# Patient Record
Sex: Female | Born: 1970 | Race: Black or African American | Hispanic: No | Marital: Married | Smoking: Current every day smoker
Health system: Southern US, Community
[De-identification: ages and names within clinical notes are randomized; demographics above are authoritative.]

## PROBLEM LIST (undated history)

## (undated) DIAGNOSIS — R011 Cardiac murmur, unspecified: Secondary | ICD-10-CM

## (undated) DIAGNOSIS — E7800 Pure hypercholesterolemia, unspecified: Secondary | ICD-10-CM

## (undated) DIAGNOSIS — F329 Major depressive disorder, single episode, unspecified: Secondary | ICD-10-CM

## (undated) DIAGNOSIS — E119 Type 2 diabetes mellitus without complications: Secondary | ICD-10-CM

## (undated) DIAGNOSIS — F32A Depression, unspecified: Secondary | ICD-10-CM

## (undated) DIAGNOSIS — R51 Headache: Secondary | ICD-10-CM

## (undated) HISTORY — PX: WISDOM TOOTH EXTRACTION: SHX21

## (undated) HISTORY — PX: OTHER SURGICAL HISTORY: SHX169

## (undated) SURGERY — Surgical Case
Anesthesia: *Unknown

---

## 2006-10-17 ENCOUNTER — Emergency Department (HOSPITAL_COMMUNITY): Admission: EM | Admit: 2006-10-17 | Discharge: 2006-10-17 | Payer: Self-pay | Admitting: Family Medicine

## 2006-12-06 ENCOUNTER — Emergency Department (HOSPITAL_COMMUNITY): Admission: EM | Admit: 2006-12-06 | Discharge: 2006-12-06 | Payer: Self-pay | Admitting: Emergency Medicine

## 2006-12-27 ENCOUNTER — Emergency Department (HOSPITAL_COMMUNITY): Admission: EM | Admit: 2006-12-27 | Discharge: 2006-12-27 | Payer: Self-pay | Admitting: Family Medicine

## 2007-01-20 ENCOUNTER — Emergency Department (HOSPITAL_COMMUNITY): Admission: EM | Admit: 2007-01-20 | Discharge: 2007-01-20 | Payer: Self-pay | Admitting: Emergency Medicine

## 2007-06-17 ENCOUNTER — Emergency Department (HOSPITAL_COMMUNITY): Admission: EM | Admit: 2007-06-17 | Discharge: 2007-06-17 | Payer: Self-pay | Admitting: Emergency Medicine

## 2007-07-10 ENCOUNTER — Emergency Department (HOSPITAL_COMMUNITY): Admission: EM | Admit: 2007-07-10 | Discharge: 2007-07-10 | Payer: Self-pay | Admitting: Emergency Medicine

## 2007-10-20 ENCOUNTER — Emergency Department (HOSPITAL_COMMUNITY): Admission: EM | Admit: 2007-10-20 | Discharge: 2007-10-20 | Payer: Self-pay | Admitting: Family Medicine

## 2008-06-12 ENCOUNTER — Emergency Department (HOSPITAL_COMMUNITY): Admission: EM | Admit: 2008-06-12 | Discharge: 2008-06-12 | Payer: Self-pay | Admitting: Family Medicine

## 2009-02-26 ENCOUNTER — Emergency Department (HOSPITAL_BASED_OUTPATIENT_CLINIC_OR_DEPARTMENT_OTHER): Admission: EM | Admit: 2009-02-26 | Discharge: 2009-02-26 | Payer: Self-pay | Admitting: Emergency Medicine

## 2009-02-26 ENCOUNTER — Ambulatory Visit: Payer: Self-pay | Admitting: Diagnostic Radiology

## 2010-09-22 LAB — POCT CARDIAC MARKERS

## 2010-09-22 LAB — D-DIMER, QUANTITATIVE: D-Dimer, Quant: 0.28 ug/mL-FEU (ref 0.00–0.48)

## 2011-01-09 ENCOUNTER — Other Ambulatory Visit (HOSPITAL_COMMUNITY)
Admission: RE | Admit: 2011-01-09 | Discharge: 2011-01-09 | Disposition: A | Discharge status: Home / Self Care | Payer: BC Managed Care – PPO | Source: Ambulatory Visit | Attending: Obstetrics and Gynecology | Admitting: Obstetrics and Gynecology

## 2011-01-09 DIAGNOSIS — Z113 Encounter for screening for infections with a predominantly sexual mode of transmission: Secondary | ICD-10-CM | POA: Insufficient documentation

## 2011-01-09 DIAGNOSIS — Z1159 Encounter for screening for other viral diseases: Secondary | ICD-10-CM | POA: Insufficient documentation

## 2011-01-09 DIAGNOSIS — Z01419 Encounter for gynecological examination (general) (routine) without abnormal findings: Secondary | ICD-10-CM | POA: Insufficient documentation

## 2011-02-23 ENCOUNTER — Other Ambulatory Visit (HOSPITAL_COMMUNITY): Payer: BC Managed Care – PPO

## 2011-03-16 ENCOUNTER — Encounter (HOSPITAL_COMMUNITY): Payer: Self-pay

## 2011-03-16 ENCOUNTER — Encounter (HOSPITAL_COMMUNITY)
Admission: RE | Admit: 2011-03-16 | Discharge: 2011-03-16 | Disposition: A | Discharge status: Home / Self Care | Payer: BC Managed Care – PPO | Source: Ambulatory Visit | Attending: Obstetrics and Gynecology | Admitting: Obstetrics and Gynecology

## 2011-03-16 HISTORY — DX: Depression, unspecified: F32.A

## 2011-03-16 HISTORY — DX: Major depressive disorder, single episode, unspecified: F32.9

## 2011-03-16 HISTORY — DX: Cardiac murmur, unspecified: R01.1

## 2011-03-16 HISTORY — DX: Headache: R51

## 2011-03-16 LAB — CBC
HCT: 37.5 % (ref 36.0–46.0)
MCHC: 32.5 g/dL (ref 30.0–36.0)
MCV: 81.2 fL (ref 78.0–100.0)
RDW: 15.4 % (ref 11.5–15.5)

## 2011-03-16 NOTE — Pre-Procedure Instructions (Signed)
Ok to see anesthesia DOS

## 2011-03-16 NOTE — Patient Instructions (Addendum)
   Your procedure is scheduled on:  Monday, Oct. 1, 2012  Enter through the [employer] of [facility] at:  6:00am Pick up the phone at the desk and dial [phone] and inform us of your arrival  Please call this number if you have any problems the morning of surgery: [phone]  Remember: Do not eat food after midnight  Do not drink clear liquids after: midnight Take these medicines the morning of surgery with a SIP OF WATER: none  Do not wear jewelry, make-up, or FINGER nail polish Do not wear lotions, powders, or perfumes.  You may wear deodorant. Do not shave 48 hours prior to surgery. Do not bring valuables to the hospital. Leave suitcase in the car. After Surgery it may be brought to your room. For patients being admitted to the hospital, checkout time is 11:00am the day of discharge.  Patients discharged on the day of surgery will not be allowed to drive home.   Name and phone number of your driver:  Fiance Joselyn Edling  cell [phone]   Remember to use your hibiclens as instructed.Please shower with 1/2 bottle the evening before your surgery and the other 1/2 bottle the morning of surgery.

## 2011-03-18 ENCOUNTER — Other Ambulatory Visit: Payer: Self-pay | Admitting: Obstetrics and Gynecology

## 2011-03-18 NOTE — H&P (Signed)
Julia Combs is an 40 y.o. female. G23P1A2 female with severe dysmenorrhea, worsening over the past few months.  Oral analgesics, including 800mg  of ibuprofen gives limited relief,  [place] in July revealed an enlarged uterus with multiple fibroids>  The patient has reguested hysterectomy>  Informed consent was given.  Pertinent Gynecological History: Menses: are regular but heavy, lasting 7-8 edays Bleeding: no intermenstual bleeding Contraception: none DES exposure: denies Blood transfusions: none Sexually transmitted diseases: no past history Previous GYN Procedures:Cesarean section  Last mammogram: normal Date: none Last pap: normal Date: 01/09/11 OB History: G3, P1   Menstrual History: Menarche age: 73 LMP 03/04/11    Past Medical History  Diagnosis Date  . Heart murmur   . Headache     otc prn  . Depression     no meds    Past Surgical History  Procedure Date  . Cesarean section   . Wisdom tooth extraction   . Tab      x 1  . Mab      x 1  no surgery required    No family history on file.  Social History:  reports that she has been smoking Cigarettes.  She has a 18 pack-year smoking history. She has never used smokeless tobacco. She reports that she does not drink alcohol or use illicit drugs.  Allergies: No Known Allergies Meds fish oil Phentermine  Illnesses None  Family Hx   Diabetes mother, siblings Heart dis mother HTN parents , siblings Cancer Aunt   ROS negative except as noted  There were no vitals taken for this visit.  PHYSEXAM  HEENT nl Chest clear Heart clear without murmer gallop rub Breasts no masses abd BS present Pelvic uterus 10 weeks size irregular No adnexal masses cx nl Ext nl w/out swelling discoloration Skin nl   Assessment/Plan:  TAH  Kadyn Chovan E 03/18/2011, 11:10 PM

## 2011-03-19 ENCOUNTER — Encounter (HOSPITAL_COMMUNITY): Payer: Self-pay | Admitting: *Deleted

## 2011-03-19 ENCOUNTER — Encounter (HOSPITAL_COMMUNITY): Payer: Self-pay | Admitting: Anesthesiology

## 2011-03-19 ENCOUNTER — Inpatient Hospital Stay (HOSPITAL_COMMUNITY)
Admission: RE | Admit: 2011-03-19 | Discharge: 2011-03-22 | DRG: 359 | Disposition: A | Discharge status: Home / Self Care | Payer: BC Managed Care – PPO | Source: Ambulatory Visit | Attending: Obstetrics and Gynecology | Admitting: Obstetrics and Gynecology

## 2011-03-19 ENCOUNTER — Other Ambulatory Visit: Payer: Self-pay | Admitting: Obstetrics and Gynecology

## 2011-03-19 ENCOUNTER — Encounter (HOSPITAL_COMMUNITY)
Admission: RE | Disposition: A | Discharge status: Home / Self Care | Payer: Self-pay | Source: Ambulatory Visit | Attending: Obstetrics and Gynecology

## 2011-03-19 ENCOUNTER — Inpatient Hospital Stay (HOSPITAL_COMMUNITY): Payer: BC Managed Care – PPO | Admitting: Anesthesiology

## 2011-03-19 DIAGNOSIS — Z01818 Encounter for other preprocedural examination: Secondary | ICD-10-CM

## 2011-03-19 DIAGNOSIS — D252 Subserosal leiomyoma of uterus: Secondary | ICD-10-CM | POA: Diagnosis present

## 2011-03-19 DIAGNOSIS — N946 Dysmenorrhea, unspecified: Principal | ICD-10-CM | POA: Diagnosis present

## 2011-03-19 DIAGNOSIS — N949 Unspecified condition associated with female genital organs and menstrual cycle: Secondary | ICD-10-CM | POA: Diagnosis present

## 2011-03-19 DIAGNOSIS — Z90710 Acquired absence of both cervix and uterus: Secondary | ICD-10-CM

## 2011-03-19 DIAGNOSIS — Z01812 Encounter for preprocedural laboratory examination: Secondary | ICD-10-CM

## 2011-03-19 DIAGNOSIS — D251 Intramural leiomyoma of uterus: Secondary | ICD-10-CM | POA: Diagnosis present

## 2011-03-19 HISTORY — PX: ABDOMINAL HYSTERECTOMY: SHX81

## 2011-03-19 LAB — CBC
HCT: 34.7 % — ABNORMAL LOW (ref 36.0–46.0)
MCHC: 33.4 g/dL (ref 30.0–36.0)
Platelets: 332 10*3/uL (ref 150–400)
RDW: 15.5 % (ref 11.5–15.5)

## 2011-03-19 LAB — SAMPLE TO BLOOD BANK

## 2011-03-19 LAB — DIFFERENTIAL
Basophils Relative: 0 % (ref 0–1)
Monocytes Absolute: 0.3 10*3/uL (ref 0.1–1.0)
Neutro Abs: 15.4 10*3/uL — ABNORMAL HIGH (ref 1.7–7.7)

## 2011-03-19 SURGERY — HYSTERECTOMY, ABDOMINAL
Anesthesia: Choice | Site: Abdomen | Wound class: Clean Contaminated

## 2011-03-19 MED ORDER — DEXAMETHASONE SODIUM PHOSPHATE 10 MG/ML IJ SOLN
INTRAMUSCULAR | Status: AC
  Filled 2011-03-19: qty 1

## 2011-03-19 MED ORDER — LACTATED RINGERS IV SOLN
INTRAVENOUS | Status: DC
  Administered 2011-03-19 (×3): via INTRAVENOUS

## 2011-03-19 MED ORDER — ONDANSETRON HCL 4 MG/2ML IJ SOLN
4.0000 mg | Freq: Three times a day (TID) | INTRAMUSCULAR | Status: DC | PRN
  Administered 2011-03-19 – 2011-03-21 (×3): 4 mg via INTRAVENOUS
  Filled 2011-03-19 (×4): qty 2

## 2011-03-19 MED ORDER — KETOROLAC TROMETHAMINE 30 MG/ML IJ SOLN
INTRAMUSCULAR | Status: AC
  Filled 2011-03-19: qty 1

## 2011-03-19 MED ORDER — KETOROLAC TROMETHAMINE 30 MG/ML IJ SOLN: 15.0000 mg | Freq: Once | INTRAMUSCULAR | Status: DC | PRN

## 2011-03-19 MED ORDER — MORPHINE SULFATE 10 MG/ML IJ SOLN
INTRAMUSCULAR | Status: AC
  Filled 2011-03-19: qty 1

## 2011-03-19 MED ORDER — CEFAZOLIN SODIUM 1-5 GM-% IV SOLN
1.0000 g | INTRAVENOUS | Status: AC
  Administered 2011-03-19: 1 g via INTRAVENOUS

## 2011-03-19 MED ORDER — MIDAZOLAM HCL 5 MG/5ML IJ SOLN
INTRAMUSCULAR | Status: DC | PRN
  Administered 2011-03-19: 2 mg via INTRAVENOUS

## 2011-03-19 MED ORDER — LIDOCAINE HCL (CARDIAC) 20 MG/ML IV SOLN
INTRAVENOUS | Status: DC | PRN
  Administered 2011-03-19: 80 mg via INTRAVENOUS

## 2011-03-19 MED ORDER — LIDOCAINE HCL (CARDIAC) 20 MG/ML IV SOLN
INTRAVENOUS | Status: AC
  Filled 2011-03-19: qty 5

## 2011-03-19 MED ORDER — NEOSTIGMINE METHYLSULFATE 1 MG/ML IJ SOLN
INTRAMUSCULAR | Status: DC | PRN
  Administered 2011-03-19: 4 mg via INTRAVENOUS

## 2011-03-19 MED ORDER — BISACODYL 10 MG RE SUPP
10.0000 mg | Freq: Every day | RECTAL | Status: DC | PRN
  Administered 2011-03-21: 10 mg via RECTAL
  Filled 2011-03-19: qty 1

## 2011-03-19 MED ORDER — KETOROLAC TROMETHAMINE 30 MG/ML IJ SOLN
INTRAMUSCULAR | Status: DC | PRN
  Administered 2011-03-19: 30 mg via INTRAVENOUS

## 2011-03-19 MED ORDER — ONDANSETRON HCL 4 MG/2ML IJ SOLN
INTRAMUSCULAR | Status: AC
  Filled 2011-03-19: qty 2

## 2011-03-19 MED ORDER — MORPHINE SULFATE 10 MG/ML IJ SOLN
INTRAMUSCULAR | Status: DC | PRN
  Administered 2011-03-19 (×2): 5 mg via INTRAVENOUS

## 2011-03-19 MED ORDER — KETOROLAC TROMETHAMINE 30 MG/ML IJ SOLN: 30.0000 mg | Freq: Once | INTRAMUSCULAR | Status: DC

## 2011-03-19 MED ORDER — SIMETHICONE 80 MG PO CHEW
80.0000 mg | CHEWABLE_TABLET | Freq: Four times a day (QID) | ORAL | Status: DC | PRN
  Administered 2011-03-21: 80 mg via ORAL

## 2011-03-19 MED ORDER — DEXTROSE IN LACTATED RINGERS 5 % IV SOLN
INTRAVENOUS | Status: DC
  Administered 2011-03-19 – 2011-03-20 (×2): via INTRAVENOUS

## 2011-03-19 MED ORDER — ACETAMINOPHEN 325 MG PO TABS: 325.0000 mg | ORAL_TABLET | ORAL | Status: DC | PRN

## 2011-03-19 MED ORDER — PROPOFOL 10 MG/ML IV EMUL
INTRAVENOUS | Status: DC | PRN
  Administered 2011-03-19: 200 mg via INTRAVENOUS

## 2011-03-19 MED ORDER — NICOTINE 21 MG/24HR TD PT24
21.0000 mg | MEDICATED_PATCH | Freq: Every day | TRANSDERMAL | Status: DC
  Administered 2011-03-19 – 2011-03-22 (×3): 21 mg via TRANSDERMAL
  Filled 2011-03-19 (×5): qty 1

## 2011-03-19 MED ORDER — FENTANYL CITRATE 0.05 MG/ML IJ SOLN
INTRAMUSCULAR | Status: DC | PRN
  Administered 2011-03-19 (×3): 50 ug via INTRAVENOUS
  Administered 2011-03-19: 100 ug via INTRAVENOUS
  Administered 2011-03-19: 150 ug via INTRAVENOUS
  Administered 2011-03-19: 100 ug via INTRAVENOUS

## 2011-03-19 MED ORDER — FENTANYL CITRATE 0.05 MG/ML IJ SOLN
INTRAMUSCULAR | Status: AC
  Administered 2011-03-19: 50 ug via INTRAVENOUS
  Filled 2011-03-19: qty 2

## 2011-03-19 MED ORDER — BUPIVACAINE ON-Q PAIN PUMP (FOR ORDER SET NO CHG)
INJECTION | Status: AC
  Filled 2011-03-19: qty 1

## 2011-03-19 MED ORDER — ROCURONIUM BROMIDE 100 MG/10ML IV SOLN
INTRAVENOUS | Status: DC | PRN
  Administered 2011-03-19: 10 mg via INTRAVENOUS
  Administered 2011-03-19: 60 mg via INTRAVENOUS

## 2011-03-19 MED ORDER — FENTANYL CITRATE 0.05 MG/ML IJ SOLN
25.0000 ug | INTRAMUSCULAR | Status: DC | PRN
  Administered 2011-03-19 (×4): 50 ug via INTRAVENOUS

## 2011-03-19 MED ORDER — MENTHOL 3 MG MT LOZG: 1.0000 | LOZENGE | OROMUCOSAL | Status: DC | PRN

## 2011-03-19 MED ORDER — MIDAZOLAM HCL 2 MG/2ML IJ SOLN
INTRAMUSCULAR | Status: AC
  Filled 2011-03-19: qty 2

## 2011-03-19 MED ORDER — MAGNESIUM CITRATE PO SOLN
296.0000 mL | Freq: Every day | ORAL | Status: DC | PRN
  Administered 2011-03-21: 296 mL via ORAL
  Filled 2011-03-19 (×2): qty 296

## 2011-03-19 MED ORDER — CEFAZOLIN SODIUM 1-5 GM-% IV SOLN
INTRAVENOUS | Status: AC
  Filled 2011-03-19: qty 50

## 2011-03-19 MED ORDER — ONDANSETRON HCL 4 MG/2ML IJ SOLN: 4.0000 mg | Freq: Once | INTRAMUSCULAR | Status: DC

## 2011-03-19 MED ORDER — PROMETHAZINE HCL 25 MG/ML IJ SOLN: 6.2500 mg | INTRAMUSCULAR | Status: DC | PRN

## 2011-03-19 MED ORDER — OXYCODONE-ACETAMINOPHEN 5-325 MG PO TABS
1.0000 | ORAL_TABLET | ORAL | Status: DC | PRN
  Administered 2011-03-19 – 2011-03-21 (×7): 2 via ORAL
  Administered 2011-03-21: 1 via ORAL
  Administered 2011-03-21: 2 via ORAL
  Administered 2011-03-21: 1 via ORAL
  Administered 2011-03-22 (×3): 2 via ORAL
  Filled 2011-03-19 (×4): qty 2
  Filled 2011-03-19: qty 1
  Filled 2011-03-19 (×7): qty 2
  Filled 2011-03-19 (×2): qty 1

## 2011-03-19 MED ORDER — GLYCOPYRROLATE 0.2 MG/ML IJ SOLN
INTRAMUSCULAR | Status: AC
  Filled 2011-03-19: qty 2

## 2011-03-19 MED ORDER — ROCURONIUM BROMIDE 50 MG/5ML IV SOLN
INTRAVENOUS | Status: AC
  Filled 2011-03-19: qty 1

## 2011-03-19 MED ORDER — SODIUM CHLORIDE 0.9 % IV SOLN: Freq: Once | INTRAVENOUS | Status: DC

## 2011-03-19 MED ORDER — IBUPROFEN 600 MG PO TABS
600.0000 mg | ORAL_TABLET | Freq: Four times a day (QID) | ORAL | Status: DC | PRN
  Administered 2011-03-20 – 2011-03-22 (×5): 600 mg via ORAL
  Filled 2011-03-19 (×5): qty 1

## 2011-03-19 MED ORDER — GLYCOPYRROLATE 0.2 MG/ML IJ SOLN
INTRAMUSCULAR | Status: DC | PRN
  Administered 2011-03-19: .6 mg via INTRAVENOUS

## 2011-03-19 MED ORDER — DEXAMETHASONE SODIUM PHOSPHATE 10 MG/ML IJ SOLN
INTRAMUSCULAR | Status: DC | PRN
  Administered 2011-03-19: 10 mg via INTRAVENOUS

## 2011-03-19 MED ORDER — PROPOFOL 10 MG/ML IV EMUL
INTRAVENOUS | Status: AC
  Filled 2011-03-19: qty 20

## 2011-03-19 MED ORDER — TEMAZEPAM 15 MG PO CAPS
15.0000 mg | ORAL_CAPSULE | Freq: Every evening | ORAL | Status: DC | PRN
  Administered 2011-03-21: 30 mg via ORAL
  Filled 2011-03-19: qty 2

## 2011-03-19 MED ORDER — FENTANYL CITRATE 0.05 MG/ML IJ SOLN
INTRAMUSCULAR | Status: AC
  Filled 2011-03-19: qty 5

## 2011-03-19 MED ORDER — SENNOSIDES-DOCUSATE SODIUM 8.6-50 MG PO TABS
2.0000 | ORAL_TABLET | Freq: Every day | ORAL | Status: DC | PRN
  Administered 2011-03-21: 2 via ORAL

## 2011-03-19 MED ORDER — HYDROMORPHONE HCL 1 MG/ML IJ SOLN
0.2000 mg | INTRAMUSCULAR | Status: DC | PRN
  Administered 2011-03-19 (×3): 0.6 mg via INTRAVENOUS
  Administered 2011-03-20: 1 mg via INTRAVENOUS
  Administered 2011-03-20 (×2): 0.6 mg via INTRAVENOUS
  Filled 2011-03-19 (×6): qty 1

## 2011-03-19 MED ORDER — NEOSTIGMINE METHYLSULFATE 1 MG/ML IJ SOLN
INTRAMUSCULAR | Status: AC
  Filled 2011-03-19: qty 10

## 2011-03-19 SURGICAL SUPPLY — 33 items
APL SKNCLS STERI-STRIP NONHPOA (GAUZE/BANDAGES/DRESSINGS) ×1
BENZOIN TINCTURE PRP APPL 2/3 (GAUZE/BANDAGES/DRESSINGS) ×2 IMPLANT
CANISTER SUCTION 2500CC (MISCELLANEOUS) ×2 IMPLANT
CHLORAPREP W/TINT 26ML (MISCELLANEOUS) ×2 IMPLANT
CLOTH BEACON ORANGE TIMEOUT ST (SAFETY) ×2 IMPLANT
CONT PATH 16OZ SNAP LID 3702 (MISCELLANEOUS) ×2 IMPLANT
DISSECTOR SPONGE CHERRY (GAUZE/BANDAGES/DRESSINGS) IMPLANT
DRAPE UTILITY XL STRL (DRAPES) ×2 IMPLANT
DRESSING TELFA 8X3 (GAUZE/BANDAGES/DRESSINGS) ×2 IMPLANT
GAUZE SPONGE 4X4 12PLY STRL LF (GAUZE/BANDAGES/DRESSINGS) ×2 IMPLANT
GAUZE SPONGE 4X4 16PLY XRAY LF (GAUZE/BANDAGES/DRESSINGS) ×2 IMPLANT
GLOVE BIO SURGEON STRL SZ7 (GLOVE) ×2 IMPLANT
GLOVE BIOGEL PI IND STRL 7.0 (GLOVE) ×2 IMPLANT
GLOVE BIOGEL PI INDICATOR 7.0 (GLOVE) ×2
GOWN PREVENTION PLUS LG XLONG (DISPOSABLE) ×4 IMPLANT
GOWN PREVENTION PLUS XLARGE (GOWN DISPOSABLE) ×2 IMPLANT
NS IRRIG 1000ML POUR BTL (IV SOLUTION) ×2 IMPLANT
PACK ABDOMINAL GYN (CUSTOM PROCEDURE TRAY) ×2 IMPLANT
PAD ABD 7.5X8 STRL (GAUZE/BANDAGES/DRESSINGS) ×2 IMPLANT
PAD OB MATERNITY 4.3X12.25 (PERSONAL CARE ITEMS) IMPLANT
SPONGE LAP 18X18 X RAY DECT (DISPOSABLE) ×2 IMPLANT
STAPLER VISISTAT 35W (STAPLE) ×2 IMPLANT
STRIP CLOSURE SKIN 1/2X4 (GAUZE/BANDAGES/DRESSINGS) ×2 IMPLANT
SUT PLAIN 2 0 XLH (SUTURE) IMPLANT
SUT VIC AB 0 CT1 18XCR BRD8 (SUTURE) ×4 IMPLANT
SUT VIC AB 0 CT1 36 (SUTURE) ×4 IMPLANT
SUT VIC AB 0 CT1 8-18 (SUTURE) ×8
SUT VIC AB 2-0 CT1 (SUTURE) ×4 IMPLANT
SUT VIC AB 4-0 KS 27 (SUTURE) IMPLANT
SUT VICRYL 0 TIES 12 18 (SUTURE) ×2 IMPLANT
TOWEL OR 17X24 6PK STRL BLUE (TOWEL DISPOSABLE) ×4 IMPLANT
TRAY FOLEY CATH 14FR (SET/KITS/TRAYS/PACK) ×2 IMPLANT
WATER STERILE IRR 1000ML POUR (IV SOLUTION) IMPLANT

## 2011-03-19 NOTE — Preoperative (Signed)
Beta Blockers   Reason not to administer Beta Blockers:Not Applicable 

## 2011-03-19 NOTE — Anesthesia Postprocedure Evaluation (Signed)
  Anesthesia Post-op Note  Patient: Julia Combs  Procedure(s) Performed:  HYSTERECTOMY ABDOMINAL  Patient Location: PACU  Anesthesia Type: General  Level of Consciousness: awake, alert  and oriented  Airway and Oxygen Therapy: Patient Spontanous Breathing  Post-op Pain: none  Post-op Assessment: Post-op Vital signs reviewed  Post-op Vital Signs: Reviewed and stable  Complications: No apparent anesthesia complications

## 2011-03-19 NOTE — Anesthesia Postprocedure Evaluation (Signed)
  Anesthesia Post-op Note  Patient: Julia Combs  Procedure(s) Performed:  HYSTERECTOMY ABDOMINAL  Patient Location: 318  Anesthesia Type: General  Level of Consciousness: awake, alert  and oriented  Airway and Oxygen Therapy: Patient Spontanous Breathing and Patient connected to nasal cannula oxygen  Post-op Pain: none  Post-op Assessment: Post-op Vital signs reviewed and Patient's Cardiovascular Status Stable  Post-op Vital Signs: Reviewed and stable  Complications: No apparent anesthesia complications

## 2011-03-19 NOTE — Transfer of Care (Signed)
Immediate Anesthesia Transfer of Care Note  Patient: Julia Combs  Procedure(s) Performed:  HYSTERECTOMY ABDOMINAL  Patient Location: PACU  Anesthesia Type: General  Level of Consciousness: sedated and patient cooperative  Airway & Oxygen Therapy: Patient Spontanous Breathing and Patient connected to nasal cannula oxygen  Post-op Assessment: Report given to PACU RN and Post -op Vital signs reviewed and stable  Post vital signs: Reviewed  Complications: No apparent anesthesia complications

## 2011-03-19 NOTE — Progress Notes (Signed)
Encounter addended by: Karleen Dolphin on: 03/19/2011  4:49 PM<BR>     Documentation filed: Notes Section

## 2011-03-19 NOTE — H&P (Signed)
Julia Combs is an 40 y.o. female. G3P1 A2 with severe dysmenorrhea, fibroids.  Symptoms have worsened over past seval months and have not been amenable to oral analgesics.  Hysterectomy requested.  Informed consent give.  Pertinent Gynecological History: Menses: regular every month with spotting approximately 7-8 days per month Bleeding: heavy Contraception: none DES exposure: unknown Blood transfusions: none Sexually transmitted diseases: no past history Previous GYN Procedures: CS  Last mammogram: na Date: na Last pap: normal Date: July 2012 OB History: G3, P1   Menstrual History: Menarche age: 52     Past Medical History  Diagnosis Date  . Heart murmur   . Headache     otc prn  . Depression     no meds    Past Surgical History  Procedure Date  . Cesarean section   . Wisdom tooth extraction   . Tab      x 1  . Mab      x 1  no surgery required   Family history of diabetes , hypertension, siblings, mother   Social History:  reports that she has been smoking Cigarettes.  She has a 18 pack-year smoking history. She has never used smokeless tobacco. She reports that she does not drink alcohol or use illicit drugs.  Allergies: nka  Prescriptions prior to admission  Medication Sig Dispense Refill  . diphenhydrAMINE (BENADRYL) 50 MG capsule Take 150 mg by mouth at bedtime as needed. For sleep.  Pt states that she takes 3 capsules at bedtime.       Marland Kitchen ibuprofen (ADVIL,MOTRIN) 800 MG tablet Take 800 mg by mouth every 8 (eight) hours as needed. For cramps       . Ibuprofen-Diphenhydramine HCl 200-25 MG CAPS Take 3 capsules by mouth at bedtime as needed. For sleep       . OVER THE COUNTER MEDICATION Take 1 packet by mouth daily. Sensa diet aid. Patient reports stopping this medication around 02/22/11 (2 weeks ago).      . phentermine 37.5 MG capsule Take 37.5 mg by mouth every morning.        Bertram Gala Glycol-Propyl Glycol 0.4-0.3 % SOLN Apply 2 drops to eye daily.            Review of Systems  Constitutional: Negative.   Eyes: Negative.   Gastrointestinal: Negative.   Genitourinary:       Severe dysmenorrhea  All other systems reviewed and are negative.    Blood pressure 124/81, pulse 84, temperature 98.2 F (36.8 C), temperature source Oral, resp. rate 18, SpO2 99.00%. Physical Exam  Constitutional: She is oriented to person, place, and time. She appears well-developed and well-nourished.  HENT:  Head: Normocephalic.  Eyes: Pupils are equal, round, and reactive to light.  Neck: Neck supple. No thyromegaly present.  Cardiovascular: Normal rate and regular rhythm.  Exam reveals no gallop and no friction rub.   No murmur heard. Respiratory: Effort normal and breath sounds normal. No stridor.  GI: Soft. Bowel sounds are normal.  Genitourinary: Vagina normal.       Uterus 10 wks, irregular  Neurological: She is alert and oriented to person, place, and time. She has normal reflexes.  Skin: Skin is warm and dry.  Psychiatric: Her behavior is normal.    No results found for this or any previous visit (from the past 24 hour(s)).  No results found.  Assessment/Plan: Severe dysmenorrhea, fibroids  Total abdominal hysterectomy  Julia Combs E 03/19/2011, 7:17 AM

## 2011-03-19 NOTE — Anesthesia Preprocedure Evaluation (Signed)
Anesthesia Evaluation  Name, MR# and DOB Patient awake  General Assessment Comment  Reviewed: Allergy & Precautions, H&P , Patient's Chart, lab work & pertinent test results, reviewed documented beta blocker date and time   History of Anesthesia Complications Negative for: history of anesthetic complications  Airway Mallampati: II TM Distance: >3 FB Neck ROM: full    Dental No notable dental hx.    Pulmonary  clear to auscultation  Pulmonary exam normal       Cardiovascular Exercise Tolerance: Good + Valvular Problems/Murmurs regular Normal Benign murmur   Neuro/Psych  Headaches, Negative Neurological ROS  Negative Psych ROS   GI/Hepatic negative GI ROS Neg liver ROS    Endo/Other  Negative Endocrine ROS  Renal/GU negative Renal ROS     Musculoskeletal   Abdominal   Peds  Hematology negative hematology ROS (+)   Anesthesia Other Findings   Reproductive/Obstetrics negative OB ROS                           Anesthesia Physical Anesthesia Plan  ASA: II  Anesthesia Plan: General   Post-op Pain Management:    Induction:   Airway Management Planned:   Additional Equipment:   Intra-op Plan:   Post-operative Plan:   Informed Consent: I have reviewed the patients History and Physical, chart, labs and discussed the procedure including the risks, benefits and alternatives for the proposed anesthesia with the patient or authorized representative who has indicated his/her understanding and acceptance.   Dental Advisory Given  Plan Discussed with: CRNA and Surgeon  Anesthesia Plan Comments:         Anesthesia Quick Evaluation

## 2011-03-19 NOTE — Brief Op Note (Signed)
03/19/2011  10:13 AM  PATIENT:  Julia Combs  40 y.o. female  PRE-OPERATIVE DIAGNOSIS:  Fibroids, Dysmenorrhea  POST-OPERATIVE DIAGNOSIS:  Fibroids, Dysmenorrhea  PROCEDURE:  Procedure(s): HYSTERECTOMY ABDOMINAL  INDICATIONS AND CONSENT  Patient is a 40 year old parous female who has had worsening dysmenorrhea and pelvic pain. Periods last 7-8 days and are heavy. Ultrasound revealed multiple fibroid tumors. The patient has been on oral analgesics without relief. Therefore she has requested surgical treatment. Total abdominal hysterectomy is planned. Risks possible complications have been discussed. Informed consent was given.    DETAILED PROCEDURE  The patient was placed on the table in supine position. General anesthesia was induced. The abdomen was sterilely prepped and draped in the usual fashion. A Pfannenstiel incision was made in the skin and extended through the abdominal layers without difficulty. The person was sharply entered and the incision was further. Visualization of the pelvis revealed enlarged uterus with multiple fibroid tumors. The procedure was initiated by packing the bowel away and after a Lenox Ahr retractor was placed. A stay suture was placed in the fundus of the uterus for traction the left round ligament was clamped cut and the anterior leaf of the broad ligament was incised. The incision was extended down anteriorly and directed toward the midline to released the peritoneum anteriorly and the bladder was reflected down. The round ligament was cauterized and tied  with 0 Vicryl suture.  All ties were with Vicryl suture unless otherwise noted.the adnexal pedicle was then clamped cut and doubly tied and then we went to the left side and the exact procedure was done in the year we continue to work our way down laterally. And the uterine vessels were clamped cut and tied bilaterally. We continued to work down the side closely for the cervix. And the cardinal  ligaments were clamped cut and tied bilaterally. At this point Lahey tenaculums were attached to the side of the cervix and the cervix was incised and the uterine fundus was removed and handed of it was noted there very little of the cervix remaining therefore the decision was made to remove the cervix at this point the bladder was carefully reflected downward sharply and bluntly and a curved Haney was placed on either sides of the cervix at the top of the vagina and the use cuff was incised and the cervix was handed off. Richardson angle stitches were placed bilaterally. The cuff was closed with 0 Vicryl in a running locked fashion. The pelvis was then irrigated and suctioned out hemostasis was excellent. At this point the pedicles were examined and were dry. The ovaries and tubes were normal and were left. The right ovary did have a corpus luteum cyst that spontaneously ruptured and was smooth and was benign in appearance. On the right the round ligament was used to stabilize the right adnexa and a suture was placed and cut at this point the procedure was terminated and all the packing was removed. The peritoneum was closed with 2-0 Vicryl in sorry 2-0 chromic in a running fashion. The fascia was closed with 0 Vicryl in a running locked fashion. The subcutaneous was closed with 2-0 plain. And the skin was approximated using a subcuticular stitch. The patient was awakened and taken to the recovery room in good condition end of dictation Dr. Stoney Bang   SURGEON:  Surgeon(s): Fortino Sic, MD  ASSISTANTS: Darrall Dears ANESTHESIA:   general  OR FLUID I/O:  Total I/O In: 2400 [I.V.:2400] Out: 330 [Urine:180; Blood:150]  BLOOD ADMINISTERED:none  DRAINS: none   LOCAL MEDICATIONS USED:  NONE  SPECIMEN:  Source of Specimen:  uterus and cervix  DISPOSITION OF SPECIMEN:  PATHOLOGY  COUNTS:  YES  TOURNIQUET:  * No tourniquets in log *  DICTATION: .Dragon Dictation  PLAN OF CARE: Admit to  inpatient   PATIENT DISPOSITION:  PACU - hemodynamically stable.   Delay start of Pharmacological VTE agent (>24hrs) due to surgical blood loss or risk of bleeding: NOT APPLICABLE

## 2011-03-20 ENCOUNTER — Encounter (HOSPITAL_COMMUNITY): Payer: Self-pay | Admitting: Obstetrics and Gynecology

## 2011-03-20 NOTE — Progress Notes (Signed)
UR Chart review completed.  

## 2011-03-21 NOTE — Progress Notes (Signed)
2 Days Post-Op Procedure(s) (LRB): HYSTERECTOMY ABDOMINAL (N/A)  Subjective: Patient reports nausea and tolerating PO.    Objective: I have reviewed patient's vital signs, intake and output and labs.  General: alert and no distress Resp: clear to auscultation bilaterally Cardio: regular rate and rhythm, S1, S2 normal, no murmur, click, rub or gallop GI: soft, non-tender; bowel sounds normal; no masses,  no organomegaly Extremities: extremities normal, atraumatic, no cyanosis or edema Vaginal Bleeding: minimal Incision nl  Assessment: s/p Procedure(s): HYSTERECTOMY ABDOMINAL: stable  Plan: Advance diet  LOS: 2 days    Julia Combs E 03/21/2011, 4:03 PM

## 2011-03-22 NOTE — Progress Notes (Signed)
3 Days Post-Op Procedure(s) (LRB): HYSTERECTOMY ABDOMINAL (N/A)  Subjective: Patient reports tolerating PO and no problems voiding.    Objective: I have reviewed patient's vital signs, intake and output and labs.  General: alert and no distress  Assessment: s/p Procedure(s): HYSTERECTOMY ABDOMINAL: tolerating diet  Plan: Discharge home  LOS: 3 days    Suraj Ramdass E 03/22/2011, 8:57 AM

## 2011-03-22 NOTE — Discharge Summary (Signed)
Physician Discharge Summary  Patient ID: [name] MRN: [id number] DOB/AGE: 1970-11-29 41 y.o.  Admit date: 03/19/2011 Discharge date: 03/22/2011  Admission Diagnoses:   Discharge Diagnoses:  Active Problems:  * No active hospital problems. *    Discharged Condition: good  Hospital Course: Uncomplicated.  Consults: none  Significant Diagnostic Studies: none  Treatments: IV hydration, analgesia: percocet and surgery:TAH Discharge Exam: Blood pressure 123/85, pulse 71, temperature 98.5 F (36.9 C), temperature source Oral, resp. rate 18, height 5\' 1"  (1.549 m), weight 81.647 kg (180 lb), SpO2 100.00%. General appearance: alert and no distress S1 S2 clear Lungs clear BS present i Incision nl Ext nl  Disposition: excellent   Current Discharge Medication List    CONTINUE these medications which have NOT CHANGED   Details  diphenhydrAMINE (BENADRYL) 50 MG capsule Take 150 mg by mouth at bedtime as needed. For sleep.  Pt states that she takes 3 capsules at bedtime.     ibuprofen (ADVIL,MOTRIN) 800 MG tablet Take 800 mg by mouth every 8 (eight) hours as needed. For cramps     Ibuprofen-Diphenhydramine HCl 200-25 MG CAPS Take 3 capsules by mouth at bedtime as needed. For sleep     OVER THE COUNTER MEDICATION Take 1 packet by mouth daily. Sensa diet aid. Patient reports stopping this medication around 02/22/11 (2 weeks ago).    phentermine 37.5 MG capsule Take 37.5 mg by mouth every morning.      Polyethyl Glycol-Propyl Glycol 0.4-0.3 % SOLN Apply 2 drops to eye daily.           SignedFortino Sic 03/22/2011, 8:59 AM   Consult with prescribing doctor on all preadmission medication orders.

## 2011-04-03 NOTE — Progress Notes (Signed)
Delayed Entry Note:  Patient was seen on POD#1, s/p TAH.  Patient was doing well postop.  No CP, SOB, F/C.  No N/V on POD#1.    AFVSS CTAB RRR + bowel sounds, hypoactive.  Appropriately tender. Incision d/d No evid dvt, Homan's neg.    A/P:  Routine postop care.  Proceed with routine advances.  Ambulate, advance diet.  Anticipated d/c on POD#3.

## 2011-12-25 ENCOUNTER — Encounter (HOSPITAL_BASED_OUTPATIENT_CLINIC_OR_DEPARTMENT_OTHER): Payer: Self-pay

## 2011-12-25 ENCOUNTER — Emergency Department (HOSPITAL_BASED_OUTPATIENT_CLINIC_OR_DEPARTMENT_OTHER): Payer: BC Managed Care – PPO

## 2011-12-25 ENCOUNTER — Emergency Department (HOSPITAL_BASED_OUTPATIENT_CLINIC_OR_DEPARTMENT_OTHER)
Admission: EM | Admit: 2011-12-25 | Discharge: 2011-12-25 | Disposition: A | Discharge status: Home / Self Care | Payer: BC Managed Care – PPO | Attending: Emergency Medicine | Admitting: Emergency Medicine

## 2011-12-25 DIAGNOSIS — R55 Syncope and collapse: Secondary | ICD-10-CM

## 2011-12-25 DIAGNOSIS — F17200 Nicotine dependence, unspecified, uncomplicated: Secondary | ICD-10-CM | POA: Insufficient documentation

## 2011-12-25 LAB — URINALYSIS, ROUTINE W REFLEX MICROSCOPIC
Bilirubin Urine: NEGATIVE
Glucose, UA: NEGATIVE mg/dL
Hgb urine dipstick: NEGATIVE
Ketones, ur: NEGATIVE mg/dL
Leukocytes, UA: NEGATIVE
Nitrite: NEGATIVE
Protein, ur: NEGATIVE mg/dL
Specific Gravity, Urine: 1.005 (ref 1.005–1.030)
Urobilinogen, UA: 0.2 mg/dL (ref 0.0–1.0)
pH: 6.5 (ref 5.0–8.0)

## 2011-12-25 LAB — CBC WITH DIFFERENTIAL/PLATELET
Basophils Absolute: 0.1 10*3/uL (ref 0.0–0.1)
Eosinophils Absolute: 0.3 10*3/uL (ref 0.0–0.7)
Lymphocytes Relative: 35 % (ref 12–46)
MCHC: 34 g/dL (ref 30.0–36.0)
Neutrophils Relative %: 53 % (ref 43–77)
RDW: 14.9 % (ref 11.5–15.5)
Smear Review: ADEQUATE

## 2011-12-25 LAB — BASIC METABOLIC PANEL
BUN: 7 mg/dL (ref 6–23)
Creatinine, Ser: 0.6 mg/dL (ref 0.50–1.10)
GFR calc Af Amer: >90 mL/min (ref >90)
GFR calc non Af Amer: >90 mL/min (ref >90)
Potassium: 3.9 mEq/L (ref 3.5–5.1)

## 2011-12-25 MED ORDER — SODIUM CHLORIDE 0.9 % IV BOLUS (SEPSIS)
1000.0000 mL | Freq: Once | INTRAVENOUS | Status: AC
  Administered 2011-12-25: 1000 mL via INTRAVENOUS

## 2011-12-25 NOTE — ED Provider Notes (Signed)
History     CSN: [id number]  Arrival date & time 12/25/11  1052   First MD Initiated Contact with Patient 12/25/11 1207      Chief Complaint  Patient presents with  . Urine Output  . Fatigue    (Consider location/radiation/quality/duration/timing/severity/associated sxs/prior treatment) HPI Comments: Pt states that 6 days she had a near syncopal vs syncopal episode:pt states that she felt like she was was going to passout when she was in the shower:pt states that her boyfriend called her name and she doesn't think she actually passed out:pt denies waking up on the floor:pt states that she drank juice and felt better:pt states that her urine has had a strong odor:pt states that that she has just been very tired over the last week:pt states that she went to her pcp 2 days after the episode and nothing was done:pt states that she is concerned about diabetes as her family has a long history  The history is provided by the patient. No language interpreter was used.    Past Medical History  Diagnosis Date  . Heart murmur   . Headache     otc prn  . Depression     no meds    Past Surgical History  Procedure Date  . Cesarean section   . Wisdom tooth extraction   . Tab      x 1  . Mab      x 1  no surgery required  . Abdominal hysterectomy 03/19/2011    Procedure: HYSTERECTOMY ABDOMINAL;  Surgeon: Fortino Sic, MD;  Location: [facility];  Service: Gynecology;  Laterality: N/A;    No family history on file.  History  Substance Use Topics  . Smoking status: Current Everyday Smoker -- 1.0 packs/day for 18 years    Types: Cigarettes  . Smokeless tobacco: Never Used  . Alcohol Use: No    OB History    Grav Para Term Preterm Abortions TAB SAB Ect Mult Living                  Review of Systems  Constitutional: Negative.   Respiratory: Negative.  Negative for shortness of breath.   Cardiovascular: Negative.  Negative for chest pain.  Neurological: Negative.     Allergies   Review of patient's allergies indicates no known allergies.  Home Medications   Current Outpatient Rx  Name Route Sig Dispense Refill  . KLONOPIN PO Oral Take by mouth.      BP 131/79  Pulse 85  Temp 98.3 F (36.8 C) (Oral)  Resp 16  Ht 5\' 1"  (1.549 m)  Wt 181 lb (82.101 kg)  BMI 34.20 kg/m2  SpO2 100%  Physical Exam  Nursing note and vitals reviewed. Constitutional: She is oriented to person, place, and time. She appears well-developed and well-nourished.  HENT:  Head: Normocephalic.  Eyes: Conjunctivae and EOM are normal.  Cardiovascular: Normal rate and regular rhythm.   Pulmonary/Chest: Effort normal and breath sounds normal.  Abdominal: Soft. Bowel sounds are normal. She exhibits no distension.  Musculoskeletal: Normal range of motion.  Neurological: She is alert and oriented to person, place, and time.  Skin: Skin is warm and dry.  Psychiatric: She has a normal mood and affect.    ED Course  Procedures (including critical care time)   Labs Reviewed  URINALYSIS, ROUTINE W REFLEX MICROSCOPIC  BASIC METABOLIC PANEL  CBC WITH DIFFERENTIAL   Dg Chest 2 View  12/25/2011  *RADIOLOGY REPORT*  Clinical Data:  Diffuse weakness.  Smoker.  CHEST - 2 VIEW  Comparison: 02/26/2009.  Findings: Normal sized heart.  Clear lungs.  Unremarkable bones.  IMPRESSION: Normal examination, unchanged.  Original Report Authenticated By: Darrol Angel, M.D.   Date: 12/25/2011  Rate: 65  Rhythm: normal sinus rhythm  QRS Axis: normal  Intervals: normal  ST/T Wave abnormalities: normal  Conduction Disutrbances:first-degree A-V block   Narrative Interpretation:   Old EKG Reviewed: unchanged     1. Near syncope       MDM  Pt is feeling better at this time:no acute abnormality noted:pt is okay to follow up with her pcp at [facility]        Teressa Lower, NP 12/25/11 1355

## 2011-12-25 NOTE — ED Notes (Signed)
Pt c/o "cloudy urine with odor" and general malaise for past several days.  Pt states she has felt weak, denies fever.  Pt denies dysuria, hematuria, increased frequency or vaginal discharge.

## 2011-12-28 NOTE — ED Provider Notes (Signed)
Medical screening examination/treatment/procedure(s) were performed by non-physician practitioner and as supervising physician I was immediately available for consultation/collaboration.   Arnetia Bronk Y. Eryn Marandola, MD 12/28/11 0744 

## 2012-01-07 ENCOUNTER — Encounter (HOSPITAL_BASED_OUTPATIENT_CLINIC_OR_DEPARTMENT_OTHER): Payer: Self-pay

## 2012-01-07 ENCOUNTER — Emergency Department (HOSPITAL_BASED_OUTPATIENT_CLINIC_OR_DEPARTMENT_OTHER): Payer: BC Managed Care – PPO

## 2012-01-07 ENCOUNTER — Emergency Department (HOSPITAL_BASED_OUTPATIENT_CLINIC_OR_DEPARTMENT_OTHER)
Admission: EM | Admit: 2012-01-07 | Discharge: 2012-01-07 | Disposition: A | Discharge status: Home / Self Care | Payer: BC Managed Care – PPO | Attending: Emergency Medicine | Admitting: Emergency Medicine

## 2012-01-07 DIAGNOSIS — F17200 Nicotine dependence, unspecified, uncomplicated: Secondary | ICD-10-CM | POA: Insufficient documentation

## 2012-01-07 DIAGNOSIS — R0602 Shortness of breath: Secondary | ICD-10-CM | POA: Insufficient documentation

## 2012-01-07 DIAGNOSIS — R079 Chest pain, unspecified: Secondary | ICD-10-CM

## 2012-01-07 LAB — COMPREHENSIVE METABOLIC PANEL
ALT: 30 U/L (ref 0–35)
Calcium: 9.1 mg/dL (ref 8.4–10.5)
Creatinine, Ser: 0.7 mg/dL (ref 0.50–1.10)
GFR calc Af Amer: >90 mL/min (ref >90)
Glucose, Bld: 155 mg/dL — ABNORMAL HIGH (ref 70–99)
Sodium: 139 mEq/L (ref 135–145)
Total Protein: 7.1 g/dL (ref 6.0–8.3)

## 2012-01-07 LAB — TROPONIN I: Troponin I: <0.3 ng/mL (ref <0.30)

## 2012-01-07 LAB — CBC
MCH: 27.1 pg (ref 26.0–34.0)
MCHC: 33.7 g/dL (ref 30.0–36.0)
Platelets: 349 10*3/uL (ref 150–400)

## 2012-01-07 MED ORDER — OXYCODONE-ACETAMINOPHEN 5-325 MG PO TABS: ORAL_TABLET | ORAL | Status: DC

## 2012-01-07 MED ORDER — IBUPROFEN 600 MG PO TABS: 600.0000 mg | ORAL_TABLET | Freq: Four times a day (QID) | ORAL | Status: AC | PRN

## 2012-01-07 MED ORDER — NITROGLYCERIN 0.4 MG SL SUBL
0.4000 mg | SUBLINGUAL_TABLET | SUBLINGUAL | Status: DC | PRN
  Filled 2012-01-07: qty 25

## 2012-01-07 MED ORDER — ASPIRIN 81 MG PO CHEW
324.0000 mg | CHEWABLE_TABLET | Freq: Once | ORAL | Status: AC
  Administered 2012-01-07: 324 mg via ORAL
  Filled 2012-01-07: qty 4

## 2012-01-07 MED ORDER — KETOROLAC TROMETHAMINE 30 MG/ML IJ SOLN
30.0000 mg | Freq: Once | INTRAMUSCULAR | Status: AC
  Administered 2012-01-07: 30 mg via INTRAVENOUS
  Filled 2012-01-07: qty 1

## 2012-01-07 MED ORDER — OXYCODONE-ACETAMINOPHEN 5-325 MG PO TABS
2.0000 | ORAL_TABLET | Freq: Once | ORAL | Status: AC
  Administered 2012-01-07: 2 via ORAL
  Filled 2012-01-07: qty 2

## 2012-01-07 MED ORDER — ONDANSETRON 4 MG PO TBDP
ORAL_TABLET | ORAL | Status: AC
  Administered 2012-01-07: 4 mg
  Filled 2012-01-07: qty 1

## 2012-01-07 NOTE — ED Provider Notes (Signed)
History     CSN: [id number]  Arrival date & time 01/07/12  0808   First MD Initiated Contact with Patient 01/07/12 0827      Chief Complaint  Patient presents with  . Chest Pain  . Shortness of Breath    (Consider location/radiation/quality/duration/timing/severity/associated sxs/prior treatment) HPI Patient is a 41 yo female who presents today complaining of 8/10 pressure-like left sided chest pain radiating up into her left shoulder that has been constant for the past 90 minutes since lifting a patient at work.  Patient has a history of having a similar experience with exertion but reports that that was accompanied with nausea whereas this was not.  Patient denies history of CAD, thromboembolic disease, HTN, DM, or HLD.  She is a smoker and has family history of CAD in both parents and a sib before the age of 3.  Patient has not taken anything for this PTA.  Patient does have a PCP.  Nothing has changed her pain.  She denies any fevers, cough, shortness of breath, variation with respiration in her symptoms.  There are no other associated or modifying factors.  Past Medical History  Diagnosis Date  . Heart murmur   . Headache     otc prn  . Depression     no meds    Past Surgical History  Procedure Date  . Cesarean section   . Wisdom tooth extraction   . Tab      x 1  . Mab      x 1  no surgery required  . Abdominal hysterectomy 03/19/2011    Procedure: HYSTERECTOMY ABDOMINAL;  Surgeon: Fortino Sic, MD;  Location: [facility];  Service: Gynecology;  Laterality: N/A;    History reviewed. No pertinent family history.  History  Substance Use Topics  . Smoking status: Current Everyday Smoker -- 1.0 packs/day for 18 years    Types: Cigarettes  . Smokeless tobacco: Never Used  . Alcohol Use: Yes     occasional    OB History    Grav Para Term Preterm Abortions TAB SAB Ect Mult Living                  Review of Systems  Constitutional: Negative.   HENT: Negative.     Eyes: Negative.   Respiratory: Negative.   Cardiovascular: Positive for chest pain.  Gastrointestinal: Negative.   Genitourinary: Negative.   Musculoskeletal: Negative.   Skin: Negative.   Neurological: Negative.   Hematological: Negative.   Psychiatric/Behavioral: Negative.   All other systems reviewed and are negative.    Allergies  Review of patient's allergies indicates no known allergies.  Home Medications   Current Outpatient Rx  Name Route Sig Dispense Refill  . KLONOPIN PO Oral Take by mouth.    . IBUPROFEN 600 MG PO TABS Oral Take 1 tablet (600 mg total) by mouth every 6 (six) hours as needed for pain. 20 tablet 0  . OXYCODONE-ACETAMINOPHEN 5-325 MG PO TABS  Take 1-2 tabs by mouth every 6 hours when necessary pain. 15 tablet 0    BP 121/74  Pulse 88  Temp 98.5 F (36.9 C) (Oral)  Resp 16  Ht 5\' 1"  (1.549 m)  Wt 180 lb (81.647 kg)  BMI 34.01 kg/m2  SpO2 100%  Physical Exam  Nursing note and vitals reviewed. GEN: Well-developed, well-nourished female in no distress HEENT: Atraumatic, normocephalic. Oropharynx clear without erythema EYES: PERRLA BL, no scleral icterus. NECK: Trachea midline, no meningismus CV:  regular rate and rhythm. No murmurs, rubs, or gallops PULM: No respiratory distress.  No crackles, wheezes, or rales. GI: soft, non-tender. No guarding, rebound, or tenderness. + bowel sounds  GU: deferred Neuro: cranial nerves grossly 2-12 intact, no abnormalities of strength or sensation, A and O x 3 MSK: Patient moves all 4 extremities symmetrically, no deformity, edema, or injury noted Skin: No rashes petechiae, purpura, or jaundice Psych: no abnormality of mood   ED Course  Procedures (including critical care time)  Indication: chest pain Please note this EKG was reviewed extemporaneously by myself.   Date: 01/07/2012  Rate: 86  Rhythm: normal sinus rhythm  QRS Axis: normal  Intervals: normal  ST/T Wave abnormalities: normal   Conduction Disutrbances:incomplete RBBB  Narrative Interpretation:   Old EKG Reviewed: unchanged      Labs Reviewed  COMPREHENSIVE METABOLIC PANEL - Abnormal; Notable for the following:    Glucose, Bld 155 (*)     Total Bilirubin 0.1 (*)     All other components within normal limits  CBC  TROPONIN I  TROPONIN I   Dg Chest 2 View  01/07/2012  *RADIOLOGY REPORT*  Clinical Data: Chest pain radiating to left shoulder after lifting a patient in bed, shortness of breath, history smoking, heart murmur  CHEST - 2 VIEW  Comparison: 01/04/2012  Findings: Normal heart size, mediastinal contours, and pulmonary vascularity. Lungs clear. No pleural effusion or pneumothorax. No acute osseous findings.  IMPRESSION: No acute abnormalities.  Original Report Authenticated By: Lollie Marrow, M.D.     1. Chest pain       MDM  Patient was evaluated by myself. Based on family history and tobacco use patient had ACS w/u though history was most consistent with MSK injury.  Patient had no significant RFs for thromboembolic disease and no further testing was indicated.  CXR showed no PNA.  CBC was without anemia or leukocytosis to suggest infectious process.  Patient was given ASA while 0 and 3 hr TNI were completed.  These were negative and symptoms improved significantly with ASA, percocet and toradol.  Patient has PCP and can follow-up for outpatient work-up with stress testing given family history.  She was discharged in good condition.        Cyndra Numbers, MD 01/07/12 [phone]

## 2012-01-07 NOTE — ED Notes (Signed)
Patient states pain is reproduceable when moving the left arm up above her head.  Pain radiates into the left shoulder.

## 2012-01-07 NOTE — ED Notes (Signed)
Repeat Troponin obtained and sent to lab.

## 2012-01-07 NOTE — ED Notes (Signed)
Pt reports onset of left chest wall pain radiating to left shoulder that started this am after lifting a patient.

## 2013-11-22 ENCOUNTER — Emergency Department (HOSPITAL_BASED_OUTPATIENT_CLINIC_OR_DEPARTMENT_OTHER)
Admission: EM | Admit: 2013-11-22 | Discharge: 2013-11-22 | Disposition: A | Discharge status: Home / Self Care | Payer: BC Managed Care – PPO | Attending: Emergency Medicine | Admitting: Emergency Medicine

## 2013-11-22 ENCOUNTER — Encounter (HOSPITAL_BASED_OUTPATIENT_CLINIC_OR_DEPARTMENT_OTHER): Payer: Self-pay | Admitting: Emergency Medicine

## 2013-11-22 DIAGNOSIS — F17200 Nicotine dependence, unspecified, uncomplicated: Secondary | ICD-10-CM | POA: Insufficient documentation

## 2013-11-22 DIAGNOSIS — R059 Cough, unspecified: Secondary | ICD-10-CM | POA: Insufficient documentation

## 2013-11-22 DIAGNOSIS — R011 Cardiac murmur, unspecified: Secondary | ICD-10-CM | POA: Insufficient documentation

## 2013-11-22 DIAGNOSIS — Z792 Long term (current) use of antibiotics: Secondary | ICD-10-CM | POA: Insufficient documentation

## 2013-11-22 DIAGNOSIS — B309 Viral conjunctivitis, unspecified: Secondary | ICD-10-CM

## 2013-11-22 DIAGNOSIS — F3289 Other specified depressive episodes: Secondary | ICD-10-CM | POA: Insufficient documentation

## 2013-11-22 DIAGNOSIS — R05 Cough: Secondary | ICD-10-CM | POA: Insufficient documentation

## 2013-11-22 DIAGNOSIS — F329 Major depressive disorder, single episode, unspecified: Secondary | ICD-10-CM | POA: Insufficient documentation

## 2013-11-22 MED ORDER — ERYTHROMYCIN 2 % EX OINT: TOPICAL_OINTMENT | CUTANEOUS | Status: DC

## 2013-11-22 MED ORDER — HYPROMELLOSE (GONIOSCOPIC) 2.5 % OP SOLN: 1.0000 [drp] | Freq: Four times a day (QID) | OPHTHALMIC | Status: AC | PRN

## 2013-11-22 NOTE — Discharge Instructions (Signed)
See the eye doctor if not getting better. DO NOT USE CONTACT LENS UNTIL INFECTION CLEARS.  Viral Conjunctivitis Conjunctivitis is an irritation (inflammation) of the clear membrane that covers the white part of the eye (the conjunctiva). The irritation can also happen on the underside of the eyelids. Conjunctivitis makes the eye red or pink in color. This is what is commonly known as pink eye. Viral conjunctivitis can spread easily (contagious). CAUSES   Infection from virus on the surface of the eye.  Infection from the irritation or injury of nearby tissues such as the eyelids or cornea.  More serious inflammation or infection on the inside of the eye.  Other eye diseases.  The use of certain eye medications. SYMPTOMS  The normally white color of the eye or the underside of the eyelid is usually pink or red in color. The pink eye is usually associated with irritation, tearing and some sensitivity to light. Viral conjunctivitis is often associated with a clear, watery discharge. If a discharge is present, there may also be some blurred vision in the affected eye. DIAGNOSIS  Conjunctivitis is diagnosed by an eye exam. The eye specialist looks for changes in the surface tissues of the eye which take on changes characteristic of the specific types of conjunctivitis. A sample of any discharge may be collected on a Q-Tip (sterile swap). The sample will be sent to a lab to see whether or not the inflammation is caused by bacterial or viral infection. TREATMENT  Viral conjunctivitis will not respond to medicines that kill germs (antibiotics). Treatment is aimed at stopping a bacterial infection on top of the viral infection. The goal of treatment is to relieve symptoms (such as itching) with antihistamine drops or other eye medications.  HOME CARE INSTRUCTIONS   To ease discomfort, apply a cool, clean wash cloth to your eye for 10 to 20 minutes, 3 to 4 times a day.  Gently wipe away any drainage  from the eye with a warm, wet washcloth or a cotton ball.  Wash your hands often with soap and use paper towels to dry.  Do not share towels or washcloths. This may spread the infection.  Change or wash your pillowcase every day.  You should not use eye make-up until the infection is gone.  Stop using contacts lenses. Ask your eye professional how to sterilize or replace them before using again. This depends on the type of contact lenses used.  Do not touch the edge of the eyelid with the eye drop bottle or ointment tube when applying medications to the affected eye. This will stop you from spreading the infection to the other eye or to others. SEEK IMMEDIATE MEDICAL CARE IF:   The infection has not improved within 3 days of beginning treatment.  A watery discharge from the eye develops.  Pain in the eye increases.  The redness is spreading.  Vision becomes blurred.  An oral temperature above 102 F (38.9 C) develops, or as your caregiver suggests.  Facial pain, redness or swelling develops.  Any problems that may be related to the prescribed medicine develop. MAKE SURE YOU:   Understand these instructions.  Will watch your condition.  Will get help right away if you are not doing well or get worse. Document Released: 06/04/2005 Document Revised: 08/27/2011 Document Reviewed: 01/22/2008 [facility] Patient Information 2014 [place].

## 2013-11-22 NOTE — ED Provider Notes (Signed)
CSN: [id number]     Arrival date & time 11/22/13  1029 History   First MD Initiated Contact with Patient 11/22/13 1154     Chief Complaint  Patient presents with  . Eye Drainage     (Consider location/radiation/quality/duration/timing/severity/associated sxs/prior Treatment) HPI Comments: Pt comes in with cc of eye drainage. Has been having cough and cold like sx for the past few days. Today, she woke up with tearing, red eye, with crusting. She thinks her vision is slightly blurry. There is mild pain to the eye as well along with itching. No trauma. Cough is dry.  The history is provided by the patient.    Past Medical History  Diagnosis Date  . Heart murmur   . Headache(784.0)     otc prn  . Depression     no meds   Past Surgical History  Procedure Laterality Date  . Cesarean section    . Wisdom tooth extraction    . Tab       x 1  . Mab       x 1  no surgery required  . Abdominal hysterectomy  03/19/2011    Procedure: HYSTERECTOMY ABDOMINAL;  Surgeon: Avel Sensor, MD;  Location: [facility];  Service: Gynecology;  Laterality: N/A;   No family history on file. History  Substance Use Topics  . Smoking status: Current Every Day Smoker -- 1.00 packs/day for 18 years    Types: Cigarettes  . Smokeless tobacco: Never Used  . Alcohol Use: Yes     Comment: occasional   OB History   Grav Para Term Preterm Abortions TAB SAB Ect Mult Living                 Review of Systems  Constitutional: Negative for fever and activity change.  HENT: Positive for congestion.   Eyes: Positive for pain, discharge, redness, itching and visual disturbance.  Respiratory: Positive for cough. Negative for shortness of breath.   Cardiovascular: Negative for chest pain.  Gastrointestinal: Negative for nausea, vomiting and abdominal pain.  Genitourinary: Negative for dysuria.  Musculoskeletal: Negative for neck pain.  Neurological: Negative for headaches.      Allergies  Review of  patient's allergies indicates no known allergies.  Home Medications   Prior to Admission medications   Medication Sig Start Date End Date Taking? Authorizing Provider  ClonazePAM (KLONOPIN PO) Take by mouth.   Yes Historical Provider, MD  phentermine 37.5 MG capsule Take 37.5 mg by mouth every morning.   Yes Historical Provider, MD  Erythromycin 2 % ointment Apply to affected area 2 times daily 11/22/13   Varney Biles, MD  hydroxypropyl methylcellulose (ISOPTO TEARS) 2.5 % ophthalmic solution Place 1 drop into the left eye 4 (four) times daily as needed for dry eyes. 11/22/13   Geraldine Tesar, MD   BP 136/91  Pulse 93  Temp(Src) 98.3 F (36.8 C) (Oral)  Resp 18  SpO2 100% Physical Exam  Nursing note and vitals reviewed. Constitutional: She is oriented to person, place, and time. She appears well-developed and well-nourished.  HENT:  Head: Normocephalic and atraumatic.  Eyes: EOM are normal. Pupils are equal, round, and reactive to light.  Left eye has scleral injection. Gross visual acuity is normal.   Neck: Neck supple.  Cardiovascular: Normal rate, regular rhythm and normal heart sounds.   No murmur heard. Pulmonary/Chest: Effort normal. No respiratory distress.  Abdominal: Soft. She exhibits no distension. There is no tenderness. There is no rebound and  no guarding.  Neurological: She is alert and oriented to person, place, and time.  Skin: Skin is warm and dry.    ED Course  Procedures (including critical care time) Labs Review Labs Reviewed - No data to display  Imaging Review No results found.   EKG Interpretation None      MDM   Final diagnoses:  Viral conjunctivitis    Pt likely has viral conjunctivitis with the constellation of her symptoms and eye exam. Antibiotic ointment for wat and watch approach - in case she develops superimposed infection. Optho f/u given if not better in 1 week.   Varney Biles, MD 11/22/13 1242

## 2013-11-22 NOTE — ED Notes (Signed)
Patient reports that she awoke with left eye redness and drainage, mild itching. Also wants to be evaluated for cough and congestion x 5 days, no distress

## 2018-01-10 ENCOUNTER — Emergency Department (HOSPITAL_COMMUNITY)
Admission: EM | Admit: 2018-01-10 | Discharge: 2018-01-10 | Disposition: A | Discharge status: Home / Self Care | Payer: 59 | Attending: Emergency Medicine | Admitting: Emergency Medicine

## 2018-01-10 ENCOUNTER — Emergency Department (HOSPITAL_COMMUNITY): Payer: 59

## 2018-01-10 ENCOUNTER — Encounter (HOSPITAL_COMMUNITY): Payer: Self-pay | Admitting: Emergency Medicine

## 2018-01-10 DIAGNOSIS — E119 Type 2 diabetes mellitus without complications: Secondary | ICD-10-CM | POA: Diagnosis not present

## 2018-01-10 DIAGNOSIS — F17210 Nicotine dependence, cigarettes, uncomplicated: Secondary | ICD-10-CM | POA: Diagnosis not present

## 2018-01-10 DIAGNOSIS — Z7984 Long term (current) use of oral hypoglycemic drugs: Secondary | ICD-10-CM | POA: Diagnosis not present

## 2018-01-10 DIAGNOSIS — R0789 Other chest pain: Secondary | ICD-10-CM | POA: Insufficient documentation

## 2018-01-10 HISTORY — DX: Type 2 diabetes mellitus without complications: E11.9

## 2018-01-10 LAB — CBC
HEMATOCRIT: 38.1 % (ref 36.0–46.0)
HEMOGLOBIN: 12.1 g/dL (ref 12.0–15.0)
MCH: 26.9 pg (ref 26.0–34.0)
MCHC: 31.8 g/dL (ref 30.0–36.0)
MCV: 84.7 fL (ref 78.0–100.0)
Platelets: 420 10*3/uL — ABNORMAL HIGH (ref 150–400)
RBC: 4.5 MIL/uL (ref 3.87–5.11)
RDW: 14.2 % (ref 11.5–15.5)
WBC: 11.6 10*3/uL — ABNORMAL HIGH (ref 4.0–10.5)

## 2018-01-10 LAB — BASIC METABOLIC PANEL
ANION GAP: 11 (ref 5–15)
BUN: 11 mg/dL (ref 6–20)
CALCIUM: 9.2 mg/dL (ref 8.9–10.3)
CHLORIDE: 109 mmol/L (ref 98–111)
CO2: 22 mmol/L (ref 22–32)
Creatinine, Ser: 0.76 mg/dL (ref 0.44–1.00)
GFR calc Af Amer: >60 mL/min (ref >60)
GFR calc non Af Amer: >60 mL/min (ref >60)
GLUCOSE: 90 mg/dL (ref 70–99)
Potassium: 3.7 mmol/L (ref 3.5–5.1)
Sodium: 142 mmol/L (ref 135–145)

## 2018-01-10 LAB — I-STAT TROPONIN, ED
TROPONIN I, POC: 0 ng/mL (ref 0.00–0.08)
Troponin i, poc: 0 ng/mL (ref 0.00–0.08)

## 2018-01-10 LAB — I-STAT BETA HCG BLOOD, ED (MC, WL, AP ONLY): I-stat hCG, quantitative: <5 m[IU]/mL (ref <5)

## 2018-01-10 MED ORDER — SODIUM CHLORIDE 0.9 % IV BOLUS
1000.0000 mL | Freq: Once | INTRAVENOUS | Status: AC
  Administered 2018-01-10: 1000 mL via INTRAVENOUS

## 2018-01-10 MED ORDER — KETOROLAC TROMETHAMINE 30 MG/ML IJ SOLN
30.0000 mg | Freq: Once | INTRAMUSCULAR | Status: AC
  Administered 2018-01-10: 30 mg via INTRAVENOUS
  Filled 2018-01-10: qty 1

## 2018-01-10 NOTE — Discharge Instructions (Signed)
Return to ED for worsening symptoms, trouble breathing or trouble swallowing, coughing or vomiting up blood, lightheadedness or loss of consciousness.

## 2018-01-10 NOTE — ED Notes (Signed)
Pt d/c without concern or complaint. Pt ambulated to lobby along with husband and with d/c paperwork in hand. D/c pain rated as 3/10, stating "I feel better".

## 2018-01-10 NOTE — ED Provider Notes (Signed)
[facility] EMERGENCY DEPARTMENT Provider Note   CSN: [id number] Arrival date & time: 01/10/18  1018     History   Chief Complaint Chief Complaint  Patient presents with  . Chest Pain    HPI Julia Combs is a 47 y.o. female with a past medical history of diabetes, tobacco use, who presents to ED for evaluation of sudden onset chest pain/heaviness that began approximately 40 minutes prior to EMS arrival.  She was at work seated in the chair when the pain began.  She states that she also had onset of shortness of breath and tingling in both of her hands.  All of the symptoms have resolved since receiving nitroglycerin and aspirin.  She reports a baseline "smoker's cough" but denies any changes from her baseline.  She reports history of similar symptoms in the past about 10 years ago, after which she came to the ED and was told that it was less likely to be cardiac etiology, and most likely because of a muscle strain.  She last saw a cardiologist several months ago after referred by her PCP.  States that "they did some stuff and told me my heart was good."  She denies any prior MI, PE or DVT, recent surgeries, recent prolonged travel, hormone use, history of cancer, hemoptysis, fever, abdominal pain, vomiting, trauma to the area.  HPI  Past Medical History:  Diagnosis Date  . Depression    no meds  . Diabetes mellitus without complication ([place])   . Headache(784.0)    otc prn  . Heart murmur     There are no active problems to display for this patient.   Past Surgical History:  Procedure Laterality Date  . ABDOMINAL HYSTERECTOMY  03/19/2011   Procedure: HYSTERECTOMY ABDOMINAL;  Surgeon: Avel Sensor, MD;  Location: [facility];  Service: Gynecology;  Laterality: Julia Combs;  . CESAREAN SECTION    . mab      x 1  no surgery required  . tab      x 1  . WISDOM TOOTH EXTRACTION       OB History   None      Home Medications    Prior to Admission medications     Medication Sig Start Date End Date Taking? Authorizing Provider  albuterol (PROVENTIL HFA;VENTOLIN HFA) 108 (90 Base) MCG/ACT inhaler Inhale 2 puffs into the lungs every 4 (four) hours as needed for wheezing. 10/19/15  Yes [provider]  Ascorbic Acid (VITAMIN C) 1000 MG tablet Take 500 mg by mouth daily.   Yes [provider]  clonazePAM (KLONOPIN) 1 MG tablet Take 1.5 mg by mouth at bedtime as needed (sleep).    Yes [provider]  cyclobenzaprine (FLEXERIL) 5 MG tablet Take 5 mg by mouth 3 (three) times daily as needed for muscle spasms.   Yes [provider]  ECHINACEA EXTRACT PO Take 1 tablet by mouth daily.   Yes [provider]  hydroxypropyl methylcellulose (ISOPTO TEARS) 2.5 % ophthalmic solution Place 1 drop into the left eye 4 (four) times daily as needed for dry eyes. 11/22/13  Yes Varney Biles, MD  Ibuprofen-diphenhydrAMINE HCl (IBUPROFEN PM) 200-25 MG CAPS Take 4 tablets by mouth as needed (pain or sleep).   Yes [provider]  metFORMIN (GLUCOPHAGE) 1000 MG tablet Take 1,000 mg by mouth 2 (two) times daily. 07/22/17  Yes [provider]  Multiple Minerals-Vitamins (CALCIUM CITRATE-MAG-MINERALS PO) Take 1 tablet by mouth daily.   Yes  [provider]  SUMAtriptan (IMITREX) 50 MG tablet Take 50 mg by mouth as directed. Take one tablet if having migraine, can take one more in 2 hrs if symptoms not completely improved. 08/02/15  Yes [provider]  vitamin B-12 (CYANOCOBALAMIN) 1000 MCG tablet Take 1,000 mcg by mouth daily.   Yes [provider]  Erythromycin 2 % ointment Apply to affected area 2 times daily Patient not taking: Reported on 01/10/2018 11/22/13   Varney Biles, MD    Family History No family history on file.  Social History Social History   Tobacco Use  . Smoking status: Current Every Day Smoker    Packs/day: 1.00    Years: 18.00    Pack years: 18.00    Types: Cigarettes  .  Smokeless tobacco: Never Used  Substance Use Topics  . Alcohol use: Yes    Comment: occasional  . Drug use: No     Allergies   Patient has no known allergies.   Review of Systems Review of Systems  Constitutional: Negative for appetite change, chills and fever.  HENT: Negative for ear pain, rhinorrhea, sneezing and sore throat.   Eyes: Negative for photophobia and visual disturbance.  Respiratory: Positive for shortness of breath. Negative for cough, chest tightness and wheezing.   Cardiovascular: Positive for chest pain. Negative for palpitations.  Gastrointestinal: Negative for abdominal pain, blood in stool, constipation, diarrhea, nausea and vomiting.  Genitourinary: Negative for dysuria, hematuria and urgency.  Musculoskeletal: Negative for myalgias.  Skin: Negative for rash.  Neurological: Negative for dizziness, weakness and light-headedness.     Physical Exam Updated Vital Signs BP 116/84 (BP Location: Right Arm)   Pulse 86   Temp 98.6 F (37 C) (Oral)   Resp 16   SpO2 99%   Physical Exam  Constitutional: She appears well-developed and well-nourished. No distress.  HENT:  Head: Normocephalic and atraumatic.  Nose: Nose normal.  Eyes: Conjunctivae and EOM are normal. Left eye exhibits no discharge. No scleral icterus.  Neck: Normal range of motion. Neck supple.  Cardiovascular: Normal rate, regular rhythm, normal heart sounds and intact distal pulses. Exam reveals no gallop and no friction rub.  No murmur heard. Pulmonary/Chest: Effort normal and breath sounds normal. No respiratory distress. She exhibits tenderness.  Tenderness to palpation of the chest on the left side as indicated in the image.    Abdominal: Soft. Bowel sounds are normal. She exhibits no distension. There is no tenderness. There is no guarding.  Musculoskeletal: Normal range of motion. She exhibits no edema.  No lower extremity edema, erythema or calf tenderness bilaterally.    Neurological: She is alert. She exhibits normal muscle tone. Coordination normal.  Skin: Skin is warm and dry. No rash noted.  Psychiatric: She has a normal mood and affect.  Nursing note and vitals reviewed.    ED Treatments / Results  Labs (all labs ordered are listed, but only abnormal results are displayed) Labs Reviewed  CBC - Abnormal; Notable for the following components:      Result Value   WBC 11.6 (*)    Platelets 420 (*)    All other components within normal limits  BASIC METABOLIC PANEL  I-STAT TROPONIN, ED  I-STAT BETA HCG BLOOD, ED ([facility], [facility], [facility] ONLY)  I-STAT TROPONIN, ED    EKG EKG Interpretation  Date/Time:  Friday January 10 2018 12:19:57 EDT Ventricular Rate:  81 PR Interval:    QRS Duration: 101 QT Interval:  409 QTC Calculation: 475  R Axis:   45 Text Interpretation:  Sinus rhythm Prolonged PR interval Probable left atrial enlargement RSR' in V1 or V2, right VCD or RVH No significant change since last tracing Confirmed by Duffy Bruce [phone]) on 01/10/2018 1:21:40 PM   Radiology Dg Chest 2 View  Result Date: 01/10/2018 CLINICAL DATA:  Acute onset chest heaviness today. EXAM: CHEST - 2 VIEW COMPARISON:  PA and lateral chest 01/07/2012. FINDINGS: The lungs are clear. Heart size is normal. No pneumothorax or pleural effusion. No acute or focal bony abnormality. IMPRESSION: Negative chest. Electronically Signed   By: Inge Rise M.D.   On: 01/10/2018 10:44    Procedures Procedures (including critical care time)  Medications Ordered in ED Medications  ketorolac (TORADOL) 30 MG/ML injection 30 mg (30 mg Intravenous Given 01/10/18 1312)  sodium chloride 0.9 % bolus 1,000 mL (1,000 mLs Intravenous New Bag/Given 01/10/18 1313)     Initial Impression / Assessment and Plan / ED Course  I have reviewed the triage vital signs and the nursing notes.  Pertinent labs & imaging results that were available during my care of the patient were reviewed by me and  considered in my medical decision making (see chart for details).     47 year old female presents to ED for evaluation of sudden onset left-sided chest pressure/pain, shortness of breath and tingling in bilateral fingers while at work approximately 40 minutes prior to arrival.  Reports improvement in her symptoms with aspirin and nitroglycerin given by EMS.  States that she has had a history of similar symptoms in the past and was told that it was due to a muscle strain.  She denies a history of MI, PE, DVT.  On physical exam she is overall well-appearing.  No lower extremity edema or calf tenderness bilaterally.  Chest pain is reproducible with palpation on the left side.  Her vital signs are stable.  She is not tachycardic, tachypneic or hypoxic.  She is afebrile.  Lab work significant for mild leukocytosis at 11.6.  BMP, troponin, hCG are unremarkable.  EKG shows no change from prior tracings. Delta trop returned as negative.  Patient is PERC negative and low risk by heart score.  Patient has had success with improvement in symptoms with medication given here.  Her symptoms could be musculoskeletal in nature based on the unremarkable lab work, vital signs and reproducibility on exam.  Advised to return to ED for any severe worsening symptoms and to follow-up with her PCP and cardiologist in the next 1 to 2 days for further evaluation.  Portions of this note were generated with [employer]. Dictation errors may occur despite best attempts at proofreading.   Final Clinical Impressions(s) / ED Diagnoses   Final diagnoses:  Chest wall pain    ED Discharge Orders    None       Delia Heady, PA-C 01/10/18 1418    Duffy Bruce, MD 01/10/18 1423

## 2018-01-10 NOTE — ED Triage Notes (Signed)
Patient brought in by [employer] from work, was seated in a chair when sudden onset of 8/10 chest heaviness. Received 324mg  aspirin and 1x SL NTG, denies change in symptoms after aspirin and NTG. Patient alert, oriented, and in no apparent distress at this time. 20g saline lock in right hand. CBG 109.

## 2019-01-13 ENCOUNTER — Encounter (HOSPITAL_BASED_OUTPATIENT_CLINIC_OR_DEPARTMENT_OTHER): Payer: Self-pay | Admitting: *Deleted

## 2019-01-13 ENCOUNTER — Other Ambulatory Visit: Payer: Self-pay

## 2019-01-13 ENCOUNTER — Emergency Department (HOSPITAL_BASED_OUTPATIENT_CLINIC_OR_DEPARTMENT_OTHER): Payer: 59

## 2019-01-13 ENCOUNTER — Emergency Department (HOSPITAL_BASED_OUTPATIENT_CLINIC_OR_DEPARTMENT_OTHER)
Admission: EM | Admit: 2019-01-13 | Discharge: 2019-01-13 | Disposition: A | Discharge status: Home / Self Care | Payer: 59 | Attending: Emergency Medicine | Admitting: Emergency Medicine

## 2019-01-13 DIAGNOSIS — R197 Diarrhea, unspecified: Secondary | ICD-10-CM | POA: Diagnosis not present

## 2019-01-13 DIAGNOSIS — R109 Unspecified abdominal pain: Secondary | ICD-10-CM | POA: Insufficient documentation

## 2019-01-13 DIAGNOSIS — Z79899 Other long term (current) drug therapy: Secondary | ICD-10-CM | POA: Diagnosis not present

## 2019-01-13 DIAGNOSIS — E119 Type 2 diabetes mellitus without complications: Secondary | ICD-10-CM | POA: Diagnosis not present

## 2019-01-13 DIAGNOSIS — Z20828 Contact with and (suspected) exposure to other viral communicable diseases: Secondary | ICD-10-CM | POA: Insufficient documentation

## 2019-01-13 DIAGNOSIS — R112 Nausea with vomiting, unspecified: Secondary | ICD-10-CM | POA: Insufficient documentation

## 2019-01-13 DIAGNOSIS — F17210 Nicotine dependence, cigarettes, uncomplicated: Secondary | ICD-10-CM | POA: Diagnosis not present

## 2019-01-13 LAB — CBC
HCT: 42.7 % (ref 36.0–46.0)
Hemoglobin: 13.4 g/dL (ref 12.0–15.0)
MCH: 26.7 pg (ref 26.0–34.0)
MCHC: 31.4 g/dL (ref 30.0–36.0)
MCV: 85.1 fL (ref 80.0–100.0)
Platelets: 439 10*3/uL — ABNORMAL HIGH (ref 150–400)
RBC: 5.02 MIL/uL (ref 3.87–5.11)
RDW: 14.5 % (ref 11.5–15.5)
WBC: 11.1 10*3/uL — ABNORMAL HIGH (ref 4.0–10.5)
nRBC: 0 % (ref 0.0–0.2)

## 2019-01-13 LAB — COMPREHENSIVE METABOLIC PANEL
ALT: 30 U/L (ref 0–44)
AST: 29 U/L (ref 15–41)
Albumin: 4.5 g/dL (ref 3.5–5.0)
Alkaline Phosphatase: 74 U/L (ref 38–126)
Anion gap: 10 (ref 5–15)
BUN: 8 mg/dL (ref 6–20)
CO2: 25 mmol/L (ref 22–32)
Calcium: 9.3 mg/dL (ref 8.9–10.3)
Chloride: 105 mmol/L (ref 98–111)
Creatinine, Ser: 0.71 mg/dL (ref 0.44–1.00)
GFR calc Af Amer: >60 mL/min (ref >60)
GFR calc non Af Amer: >60 mL/min (ref >60)
Glucose, Bld: 99 mg/dL (ref 70–99)
Potassium: 3.7 mmol/L (ref 3.5–5.1)
Sodium: 140 mmol/L (ref 135–145)
Total Bilirubin: 0.4 mg/dL (ref 0.3–1.2)
Total Protein: 7.9 g/dL (ref 6.5–8.1)

## 2019-01-13 LAB — URINALYSIS, ROUTINE W REFLEX MICROSCOPIC
Bilirubin Urine: NEGATIVE
Glucose, UA: NEGATIVE mg/dL
Hgb urine dipstick: NEGATIVE
Ketones, ur: NEGATIVE mg/dL
Leukocytes,Ua: NEGATIVE
Nitrite: NEGATIVE
Protein, ur: NEGATIVE mg/dL
Specific Gravity, Urine: 1.02 (ref 1.005–1.030)
pH: 6 (ref 5.0–8.0)

## 2019-01-13 LAB — LIPASE, BLOOD: Lipase: 38 U/L (ref 11–51)

## 2019-01-13 MED ORDER — ONDANSETRON 4 MG PO TBDP: 4.0000 mg | ORAL_TABLET | Freq: Three times a day (TID) | ORAL | 0 refills | Status: AC | PRN

## 2019-01-13 MED ORDER — ONDANSETRON 4 MG PO TBDP
4.0000 mg | ORAL_TABLET | Freq: Once | ORAL | Status: AC | PRN
  Administered 2019-01-13: 4 mg via ORAL
  Filled 2019-01-13: qty 1

## 2019-01-13 MED ORDER — SODIUM CHLORIDE 0.9 % IV BOLUS
1000.0000 mL | Freq: Once | INTRAVENOUS | Status: AC
  Administered 2019-01-13: 1000 mL via INTRAVENOUS

## 2019-01-13 MED ORDER — METRONIDAZOLE 500 MG PO TABS
500.0000 mg | ORAL_TABLET | Freq: Once | ORAL | Status: AC
  Administered 2019-01-13: 17:00:00 500 mg via ORAL
  Filled 2019-01-13: qty 1

## 2019-01-13 MED ORDER — CIPROFLOXACIN HCL 500 MG PO TABS
500.0000 mg | ORAL_TABLET | Freq: Once | ORAL | Status: AC
  Administered 2019-01-13: 500 mg via ORAL
  Filled 2019-01-13: qty 1

## 2019-01-13 MED ORDER — CIPROFLOXACIN HCL 500 MG PO TABS: 500.0000 mg | ORAL_TABLET | Freq: Two times a day (BID) | ORAL | 0 refills | Status: DC

## 2019-01-13 MED ORDER — SODIUM CHLORIDE 0.9% FLUSH
3.0000 mL | Freq: Once | INTRAVENOUS | Status: AC
  Administered 2019-01-13: 3 mL via INTRAVENOUS
  Filled 2019-01-13: qty 3

## 2019-01-13 MED ORDER — IOHEXOL 300 MG/ML  SOLN
100.0000 mL | Freq: Once | INTRAMUSCULAR | Status: AC | PRN
  Administered 2019-01-13: 100 mL via INTRAVENOUS

## 2019-01-13 MED ORDER — METRONIDAZOLE 500 MG PO TABS: 500.0000 mg | ORAL_TABLET | Freq: Two times a day (BID) | ORAL | 0 refills | Status: DC

## 2019-01-13 NOTE — Discharge Instructions (Signed)
OTC imodium as needed for diarrhea.

## 2019-01-13 NOTE — ED Notes (Signed)
Patient transported to CT 

## 2019-01-13 NOTE — ED Notes (Signed)
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

## 2019-01-13 NOTE — ED Notes (Signed)
Zofran relieved her nausea.

## 2019-01-13 NOTE — ED Triage Notes (Signed)
Nausea vomiting and diarrhea x 2 weeks. She called her MD and was told to come here for possible IV fluids.

## 2019-01-13 NOTE — ED Provider Notes (Signed)
[facility] EMERGENCY DEPARTMENT Provider Note   CSN: [id number] Arrival date & time: 01/13/19  1212    History   Chief Complaint Chief Complaint  Patient presents with   Emesis   Diarrhea    HPI Julia Combs is a 48 y.o. female.     Pt presents to the ED today with n/v/d.  She said it is not constant, but it is not going away.  The pt had multiple episodes of diarrhea today and had to leave work.  She is a home health nurse, but does not think she's had c.diff or covid exposures.  No f/c.  No recent abx use.     Past Medical History:  Diagnosis Date   Depression    no meds   Diabetes mellitus without complication (HCC)    WLSLHTDS(287.6)    otc prn   Heart murmur     There are no active problems to display for this patient.   Past Surgical History:  Procedure Laterality Date   ABDOMINAL HYSTERECTOMY  03/19/2011   Procedure: HYSTERECTOMY ABDOMINAL;  Surgeon: Avel Sensor, MD;  Location: [facility];  Service: Gynecology;  Laterality: N/A;   CESAREAN SECTION     mab      x 1  no surgery required   tab      x 1   WISDOM TOOTH EXTRACTION       OB History   No obstetric history on file.      Home Medications    Prior to Admission medications   Medication Sig Start Date End Date Taking? Authorizing Provider  albuterol (PROVENTIL HFA;VENTOLIN HFA) 108 (90 Base) MCG/ACT inhaler Inhale 2 puffs into the lungs every 4 (four) hours as needed for wheezing. 10/19/15  Yes [provider]  Ascorbic Acid (VITAMIN C) 1000 MG tablet Take 500 mg by mouth daily.   Yes [provider]  FERROUS SULFATE PO Take by mouth.   Yes [provider]  Ibuprofen-diphenhydrAMINE HCl (IBUPROFEN PM) 200-25 MG CAPS Take 4 tablets by mouth as needed (pain or sleep).   Yes [provider]  metFORMIN (GLUCOPHAGE) 1000 MG tablet Take 1,000 mg by mouth 2 (two) times daily. 07/22/17  Yes [provider]  Multiple  Minerals-Vitamins (CALCIUM CITRATE-MAG-MINERALS PO) Take 1 tablet by mouth daily.   Yes [provider]  SUMAtriptan (IMITREX) 50 MG tablet Take 50 mg by mouth as directed. Take one tablet if having migraine, can take one more in 2 hrs if symptoms not completely improved. 08/02/15  Yes [provider]  ciprofloxacin (CIPRO) 500 MG tablet Take 1 tablet (500 mg total) by mouth 2 (two) times daily. 01/13/19   Isla Pence, MD  clonazePAM (KLONOPIN) 1 MG tablet Take 1.5 mg by mouth at bedtime as needed (sleep).     [provider]  cyclobenzaprine (FLEXERIL) 5 MG tablet Take 5 mg by mouth 3 (three) times daily as needed for muscle spasms.    [provider]  ECHINACEA EXTRACT PO Take 1 tablet by mouth daily.    [provider]  Erythromycin 2 % ointment Apply to affected area 2 times daily Patient not taking: Reported on 01/10/2018 11/22/13   Varney Biles, MD  hydroxypropyl methylcellulose (ISOPTO TEARS) 2.5 % ophthalmic solution Place 1 drop into the left eye 4 (four) times daily as needed for dry eyes. 11/22/13   Varney Biles, MD  metroNIDAZOLE (FLAGYL) 500 MG tablet Take 1 tablet (500 mg total) by mouth 2 (  two) times daily. 01/13/19   Isla Pence, MD  ondansetron (ZOFRAN ODT) 4 MG disintegrating tablet Take 1 tablet (4 mg total) by mouth every 8 (eight) hours as needed. 01/13/19   Isla Pence, MD  vitamin B-12 (CYANOCOBALAMIN) 1000 MCG tablet Take 1,000 mcg by mouth daily.    [provider]    Family History No family history on file.  Social History Social History   Tobacco Use   Smoking status: Current Every Day Smoker    Packs/day: 1.00    Years: 18.00    Pack years: 18.00    Types: Cigarettes   Smokeless tobacco: Never Used  Substance Use Topics   Alcohol use: Yes    Comment: occasional   Drug use: No     Allergies   Patient has no known allergies.   Review of Systems Review of Systems  Gastrointestinal:  Positive for abdominal pain, diarrhea and vomiting.  All other systems reviewed and are negative.    Physical Exam Updated Vital Signs BP (!) 144/83    Pulse 74    Temp 98.7 F (37.1 C) (Oral)    Resp 19    Ht 5\' 1"  (1.549 m)    Wt 76.7 kg    SpO2 100%    BMI 31.93 kg/m   Physical Exam Vitals signs and nursing note reviewed.  Constitutional:      Appearance: Normal appearance.  HENT:     Head: Normocephalic and atraumatic.     Right Ear: External ear normal.     Left Ear: External ear normal.     Nose: Nose normal.     Mouth/Throat:     Mouth: Mucous membranes are dry.  Eyes:     Extraocular Movements: Extraocular movements intact.     Conjunctiva/sclera: Conjunctivae normal.     Pupils: Pupils are equal, round, and reactive to light.  Neck:     Musculoskeletal: Normal range of motion and neck supple.  Cardiovascular:     Rate and Rhythm: Normal rate and regular rhythm.     Pulses: Normal pulses.     Heart sounds: Normal heart sounds.  Pulmonary:     Effort: Pulmonary effort is normal.     Breath sounds: Normal breath sounds.  Abdominal:     General: Abdomen is flat. Bowel sounds are normal.     Palpations: Abdomen is soft.  Musculoskeletal: Normal range of motion.  Skin:    General: Skin is warm and dry.     Capillary Refill: Capillary refill takes less than 2 seconds.  Neurological:     General: No focal deficit present.     Mental Status: She is alert and oriented to person, place, and time.  Psychiatric:        Mood and Affect: Mood normal.        Behavior: Behavior normal.        Thought Content: Thought content normal.        Judgment: Judgment normal.      ED Treatments / Results  Labs (all labs ordered are listed, but only abnormal results are displayed) Labs Reviewed  CBC - Abnormal; Notable for the following components:      Result Value   WBC 11.1 (*)    Platelets 439 (*)    All other components within normal limits  GASTROINTESTINAL PANEL BY  PCR, STOOL (REPLACES STOOL CULTURE)  C DIFFICILE QUICK SCREEN W PCR REFLEX  NOVEL CORONAVIRUS, NAA (HOSPITAL ORDER, SEND-OUT TO REF LAB)  LIPASE, BLOOD  COMPREHENSIVE METABOLIC PANEL  URINALYSIS, ROUTINE W REFLEX MICROSCOPIC    EKG None  Radiology Ct Abdomen Pelvis W Contrast  Result Date: 01/13/2019 CLINICAL DATA:  Nausea, vomiting, and diarrhea EXAM: CT ABDOMEN AND PELVIS WITH CONTRAST TECHNIQUE: Multidetector CT imaging of the abdomen and pelvis was performed using the standard protocol following bolus administration of intravenous contrast. CONTRAST:  17mL OMNIPAQUE IOHEXOL 300 MG/ML  SOLN COMPARISON:  None. FINDINGS: Lower chest: Lung bases are clear. Hepatobiliary: No focal liver lesions are evident. Gallbladder wall is not appreciably thickened. There is no biliary duct dilatation. Pancreas: There is no pancreatic mass or inflammatory focus. Spleen: No splenic lesions are evident. Adrenals/Urinary Tract: Adrenals bilaterally appear normal. Kidneys bilaterally show no evident mass or hydronephrosis on either side. There is no evident renal or ureteral calculus on either side. Urinary bladder is midline with wall thickness within normal limits. Stomach/Bowel: There is slight generalized bowel wall thickening throughout much of the jejunum. No evident bowel obstruction. Terminal ileum appears unremarkable. No free air or portal venous air evident. Vascular/Lymphatic: No abdominal aortic aneurysm. No vascular lesions are demonstrable. No adenopathy is appreciable in the abdomen or pelvis by size criteria. There are subcentimeter inguinal lymph nodes, considered nonspecific. Reproductive: Uterus is absent.  No evident pelvic mass. Other: There is no periappendiceal region inflammation. Appendix not seen. No evident abscess or ascites in the abdomen or pelvis. Musculoskeletal: There are no blastic or lytic bone lesions. No intramuscular or abdominal wall lesions are evident. IMPRESSION: 1. There  are loops of mildly dilated jejunum, likely due to a degree of enteritis. No bowel obstruction evident. 2. No abscess in the abdomen or pelvis. No periappendiceal region inflammation. 3. No renal or ureteral calculus. No hydronephrosis. Urinary bladder wall thickness within normal limits. Electronically Signed   By: Lowella Grip III M.D.   On: 01/13/2019 17:11    Procedures Procedures (including critical care time)  Medications Ordered in ED Medications  ciprofloxacin (CIPRO) tablet 500 mg (has no administration in time range)  metroNIDAZOLE (FLAGYL) tablet 500 mg (has no administration in time range)  sodium chloride flush (NS) 0.9 % injection 3 mL (3 mLs Intravenous Given 01/13/19 1241)  ondansetron (ZOFRAN-ODT) disintegrating tablet 4 mg (4 mg Oral Given 01/13/19 1244)  sodium chloride 0.9 % bolus 1,000 mL (1,000 mLs Intravenous New Bag/Given 01/13/19 1531)  iohexol (OMNIPAQUE) 300 MG/ML solution 100 mL (100 mLs Intravenous Contrast Given 01/13/19 1639)     Initial Impression / Assessment and Plan / ED Course  I have reviewed the triage vital signs and the nursing notes.  Pertinent labs & imaging results that were available during my care of the patient were reviewed by me and considered in my medical decision making (see chart for details).     Pt is feeling much better.  She has not given a stool sample while here.  Pt is started on cipro and flagyl as sx have been going on for 2 weeks.  She knows to return if worse.  covid test pending.  CARLISIA GENO was evaluated in Emergency Department on 01/13/2019 for the symptoms described in the history of present illness. She was evaluated in the context of the global COVID-19 pandemic, which necessitated consideration that the patient might be at risk for infection with the SARS-CoV-2 virus that causes COVID-19. Institutional protocols and algorithms that pertain to the evaluation of patients at risk for COVID-19 are in a state of rapid  change based on information released  by regulatory bodies including the CDC and federal and state organizations. These policies and algorithms were followed during the patient's care in the ED.  Final Clinical Impressions(s) / ED Diagnoses   Final diagnoses:  Nausea vomiting and diarrhea    ED Discharge Orders         Ordered    ciprofloxacin (CIPRO) 500 MG tablet  2 times daily     01/13/19 1719    metroNIDAZOLE (FLAGYL) 500 MG tablet  2 times daily     01/13/19 1719    ondansetron (ZOFRAN ODT) 4 MG disintegrating tablet  Every 8 hours PRN     01/13/19 1720           Isla Pence, MD 01/13/19 1721

## 2019-01-13 NOTE — ED Notes (Signed)
Pt informed for the need of stool specimen.

## 2019-01-15 LAB — NOVEL CORONAVIRUS, NAA (HOSP ORDER, SEND-OUT TO REF LAB; TAT 18-24 HRS): SARS-CoV-2, NAA: NOT DETECTED

## 2019-06-22 DIAGNOSIS — R05 Cough: Secondary | ICD-10-CM | POA: Diagnosis not present

## 2019-06-22 DIAGNOSIS — R0982 Postnasal drip: Secondary | ICD-10-CM | POA: Diagnosis not present

## 2019-06-22 DIAGNOSIS — M791 Myalgia, unspecified site: Secondary | ICD-10-CM | POA: Diagnosis not present

## 2019-06-22 DIAGNOSIS — J029 Acute pharyngitis, unspecified: Secondary | ICD-10-CM | POA: Diagnosis not present

## 2019-06-22 DIAGNOSIS — R0981 Nasal congestion: Secondary | ICD-10-CM | POA: Diagnosis not present

## 2019-06-22 DIAGNOSIS — Z20822 Contact with and (suspected) exposure to covid-19: Secondary | ICD-10-CM | POA: Diagnosis not present

## 2019-06-22 DIAGNOSIS — J3489 Other specified disorders of nose and nasal sinuses: Secondary | ICD-10-CM | POA: Diagnosis not present

## 2019-06-22 DIAGNOSIS — R438 Other disturbances of smell and taste: Secondary | ICD-10-CM | POA: Diagnosis not present

## 2019-06-26 DIAGNOSIS — J019 Acute sinusitis, unspecified: Secondary | ICD-10-CM | POA: Diagnosis not present

## 2019-07-22 DIAGNOSIS — Z20828 Contact with and (suspected) exposure to other viral communicable diseases: Secondary | ICD-10-CM | POA: Diagnosis not present

## 2019-11-05 ENCOUNTER — Emergency Department (HOSPITAL_BASED_OUTPATIENT_CLINIC_OR_DEPARTMENT_OTHER)
Admission: EM | Admit: 2019-11-05 | Discharge: 2019-11-05 | Disposition: A | Discharge status: Home / Self Care | Payer: 59 | Attending: Emergency Medicine | Admitting: Emergency Medicine

## 2019-11-05 ENCOUNTER — Other Ambulatory Visit: Payer: Self-pay

## 2019-11-05 ENCOUNTER — Emergency Department (HOSPITAL_BASED_OUTPATIENT_CLINIC_OR_DEPARTMENT_OTHER): Payer: 59

## 2019-11-05 ENCOUNTER — Encounter (HOSPITAL_BASED_OUTPATIENT_CLINIC_OR_DEPARTMENT_OTHER): Payer: Self-pay

## 2019-11-05 DIAGNOSIS — E782 Mixed hyperlipidemia: Secondary | ICD-10-CM | POA: Diagnosis not present

## 2019-11-05 DIAGNOSIS — R0789 Other chest pain: Secondary | ICD-10-CM

## 2019-11-05 DIAGNOSIS — R69 Illness, unspecified: Secondary | ICD-10-CM | POA: Diagnosis not present

## 2019-11-05 DIAGNOSIS — Z7984 Long term (current) use of oral hypoglycemic drugs: Secondary | ICD-10-CM | POA: Insufficient documentation

## 2019-11-05 DIAGNOSIS — E119 Type 2 diabetes mellitus without complications: Secondary | ICD-10-CM | POA: Insufficient documentation

## 2019-11-05 DIAGNOSIS — F17210 Nicotine dependence, cigarettes, uncomplicated: Secondary | ICD-10-CM | POA: Diagnosis not present

## 2019-11-05 DIAGNOSIS — R9431 Abnormal electrocardiogram [ECG] [EKG]: Secondary | ICD-10-CM | POA: Insufficient documentation

## 2019-11-05 DIAGNOSIS — Z79899 Other long term (current) drug therapy: Secondary | ICD-10-CM | POA: Diagnosis not present

## 2019-11-05 DIAGNOSIS — R079 Chest pain, unspecified: Secondary | ICD-10-CM | POA: Diagnosis not present

## 2019-11-05 HISTORY — DX: Pure hypercholesterolemia, unspecified: E78.00

## 2019-11-05 LAB — BASIC METABOLIC PANEL
Anion gap: 12 (ref 5–15)
BUN: 10 mg/dL (ref 6–20)
CO2: 25 mmol/L (ref 22–32)
Calcium: 9.1 mg/dL (ref 8.9–10.3)
Chloride: 103 mmol/L (ref 98–111)
Creatinine, Ser: 0.7 mg/dL (ref 0.44–1.00)
GFR calc Af Amer: >60 mL/min (ref >60)
GFR calc non Af Amer: >60 mL/min (ref >60)
Glucose, Bld: 83 mg/dL (ref 70–99)
Potassium: 3.7 mmol/L (ref 3.5–5.1)
Sodium: 140 mmol/L (ref 135–145)

## 2019-11-05 LAB — CBC
HCT: 39.9 % (ref 36.0–46.0)
Hemoglobin: 13.2 g/dL (ref 12.0–15.0)
MCH: 27.2 pg (ref 26.0–34.0)
MCHC: 33.1 g/dL (ref 30.0–36.0)
MCV: 82.3 fL (ref 80.0–100.0)
Platelets: 427 10*3/uL — ABNORMAL HIGH (ref 150–400)
RBC: 4.85 MIL/uL (ref 3.87–5.11)
RDW: 14.3 % (ref 11.5–15.5)
WBC: 9.9 10*3/uL (ref 4.0–10.5)
nRBC: 0 % (ref 0.0–0.2)

## 2019-11-05 LAB — TROPONIN I (HIGH SENSITIVITY)
Troponin I (High Sensitivity): 3 ng/L (ref <18)
Troponin I (High Sensitivity): 3 ng/L (ref <18)

## 2019-11-05 MED ORDER — SODIUM CHLORIDE 0.9% FLUSH
3.0000 mL | Freq: Once | INTRAVENOUS | Status: DC
  Filled 2019-11-05: qty 3

## 2019-11-05 NOTE — ED Triage Notes (Signed)
C/o CP x 2 days-denies fever/flu sx-NAD-steady gait

## 2019-11-05 NOTE — ED Provider Notes (Signed)
[facility] EMERGENCY DEPARTMENT Provider Note   CSN: [id number] Arrival date & time: 11/05/19  1308     History Chief Complaint  Patient presents with  . Chest Pain    Julia Combs is a 49 y.o. female.  Who presents emergency department chief complaint of left-sided chest pain.  She had onset of chest pain around 9:30 AM yesterday while sitting still.  She states that the pain is sharp, fleeting lasting only seconds at a time, on the left side of the chest, radiating into the left shoulder.  Nothing seems to make it happen however it does feel better when she massages the left chest wall.  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  She denies cough, unilateral leg swelling, pleuritic chest pain, hemoptysis, use of exogenous estrogens.  She has no previous history of DVT or PE, recent confinement or surgeries.  She denies nausea, vomiting, diaphoresis.  HPI  HPI: A 49 year old patient with a history of treated diabetes, hypertension, hypercholesterolemia and obesity presents for evaluation of chest pain. Initial onset of pain was more than 6 hours ago. The patient's chest pain is sharp and is not worse with exertion. The patient's chest pain is middle- or left-sided, is not well-localized, is not described as heaviness/pressure/tightness and does radiate to the arms/jaw/neck. The patient does not complain of nausea and denies diaphoresis. The patient has smoked in the past 90 days and has a family history of coronary artery disease in a first-degree relative with onset less than age 54. The patient has no history of stroke and has no history of peripheral artery disease.   Past Medical History:  Diagnosis Date  . Depression    no meds  . Diabetes mellitus without complication ([place])   . Headache(784.0)    otc prn  . Heart murmur   . High cholesterol     There are no problems  to display for this patient.   Past Surgical History:  Procedure Laterality Date  . ABDOMINAL HYSTERECTOMY  03/19/2011   Procedure: HYSTERECTOMY ABDOMINAL;  Surgeon: Avel Sensor, MD;  Location: [facility];  Service: Gynecology;  Laterality: N/A;  . CESAREAN SECTION    . mab      x 1  no surgery required  . tab      x 1  . WISDOM TOOTH EXTRACTION       OB History   No obstetric history on file.     No family history on file.  Social History   Tobacco Use  . Smoking status: Current Every Day Smoker    Packs/day: 1.00    Years: 18.00    Pack years: 18.00    Types: Cigarettes  . Smokeless tobacco: Never Used  Substance Use Topics  . Alcohol use: Yes    Comment: occasional  . Drug use: No    Home Medications Prior to Admission medications   Medication Sig Start Date End Date Taking? Authorizing Provider  albuterol (PROVENTIL HFA;VENTOLIN HFA) 108 (90 Base) MCG/ACT inhaler Inhale 2 puffs into the lungs every 4 (four) hours as needed for wheezing. 10/19/15   [provider]  Ascorbic Acid (VITAMIN C) 1000 MG tablet Take 500 mg by mouth daily.    [provider]  ciprofloxacin (CIPRO) 500 MG tablet Take 1 tablet (500 mg total) by mouth 2 (two) times daily. 01/13/19   Isla Pence,  MD  clonazePAM (KLONOPIN) 1 MG tablet Take 1.5 mg by mouth at bedtime as needed (sleep).     [provider]  cyclobenzaprine (FLEXERIL) 5 MG tablet Take 5 mg by mouth 3 (three) times daily as needed for muscle spasms.    [provider]  ECHINACEA EXTRACT PO Take 1 tablet by mouth daily.    [provider]  Erythromycin 2 % ointment Apply to affected area 2 times daily Patient not taking: Reported on 01/10/2018 11/22/13   Varney Biles, MD  FERROUS SULFATE PO Take by mouth.    [provider]  hydroxypropyl methylcellulose (ISOPTO TEARS) 2.5 % ophthalmic solution Place 1 drop into the left eye 4 (four) times daily as needed for dry eyes. 11/22/13    Varney Biles, MD  Ibuprofen-diphenhydrAMINE HCl (IBUPROFEN PM) 200-25 MG CAPS Take 4 tablets by mouth as needed (pain or sleep).    [provider]  metFORMIN (GLUCOPHAGE) 1000 MG tablet Take 1,000 mg by mouth 2 (two) times daily. 07/22/17   [provider]  metroNIDAZOLE (FLAGYL) 500 MG tablet Take 1 tablet (500 mg total) by mouth 2 (two) times daily. 01/13/19   Isla Pence, MD  Multiple Minerals-Vitamins (CALCIUM CITRATE-MAG-MINERALS PO) Take 1 tablet by mouth daily.    [provider]  ondansetron (ZOFRAN ODT) 4 MG disintegrating tablet Take 1 tablet (4 mg total) by mouth every 8 (eight) hours as needed. 01/13/19   Isla Pence, MD  SUMAtriptan (IMITREX) 50 MG tablet Take 50 mg by mouth as directed. Take one tablet if having migraine, can take one more in 2 hrs if symptoms not completely improved. 08/02/15   [provider]  vitamin B-12 (CYANOCOBALAMIN) 1000 MCG tablet Take 1,000 mcg by mouth daily.    [provider]    Allergies    Patient has no known allergies.  Review of Systems   Review of Systems Ten systems reviewed and are negative for acute change, except as noted in the HPI.    Physical Exam Updated Vital Signs BP (!) 110/98   Pulse 76   Temp 98.7 F (37.1 C) (Oral)   Resp 20   Ht 5\' 1"  (1.549 m)   Wt 72.1 kg   SpO2 100%   BMI 30.04 kg/m   Physical Exam Vitals and nursing note reviewed.  Constitutional:      General: She is not in acute distress.    Appearance: She is well-developed. She is not diaphoretic.  HENT:     Head: Normocephalic and atraumatic.  Eyes:     General: No scleral icterus.    Conjunctiva/sclera: Conjunctivae normal.  Cardiovascular:     Rate and Rhythm: Normal rate and regular rhythm.     Heart sounds: Normal heart sounds. No murmur. No friction rub. No gallop.   Pulmonary:     Effort: Pulmonary effort is normal. No respiratory distress.     Breath sounds: Normal breath sounds.  Chest:       Chest wall: Tenderness present.    Abdominal:     General: Bowel sounds are normal. There is no distension.     Palpations: Abdomen is soft. There is no mass.     Tenderness: There is no abdominal tenderness. There is no guarding.  Musculoskeletal:     Cervical back: Normal range of motion.  Skin:    General: Skin is warm and dry.  Neurological:     Mental Status: She is alert and oriented to person, place, and time.  Psychiatric:        Behavior: Behavior normal.     ED Results / Procedures / Treatments   Labs (all labs ordered are listed, but only abnormal results are displayed) Labs Reviewed  CBC - Abnormal; Notable for the following components:      Result Value   Platelets 427 (*)    All other components within normal limits  BASIC METABOLIC PANEL  TROPONIN I (HIGH SENSITIVITY)  TROPONIN I (HIGH SENSITIVITY)  TROPONIN I (HIGH SENSITIVITY)    EKG EKG Interpretation  Date/Time:  Thursday Nov 05 2019 13:11:01 EDT Ventricular Rate:  97 PR Interval:  164 QRS Duration: 88 QT Interval:  374 QTC Calculation: 474 R Axis:   67 Text Interpretation: Normal sinus rhythm Cannot rule out Anterior infarct , age undetermined Abnormal ECG Since prior ECG< TW changes lead III new, similar ST changes in V2 Confirmed by Gareth Morgan [phone]) on 11/05/2019 1:41:39 PM   Radiology DG Chest 2 View  Result Date: 11/05/2019 CLINICAL DATA:  Left-sided chest pain EXAM: CHEST - 2 VIEW COMPARISON:  01/13/2018 FINDINGS: The heart size and mediastinal contours are within normal limits. Both lungs are clear. The visualized skeletal structures are unremarkable. IMPRESSION: No active cardiopulmonary disease. Electronically Signed   By: Davina Poke D.O.   On: 11/05/2019 13:37    Procedures Procedures (including critical care time)  Medications Ordered in ED Medications  sodium chloride flush (NS) 0.9 % injection 3 mL (3 mLs Intravenous Not Given 11/05/19 1352)    ED Course  I  have reviewed the triage vital signs and the nursing notes.  Pertinent labs & imaging results that were available during my care of the patient were reviewed by me and considered in my medical decision making (see chart for details).    MDM Rules/Calculators/A&P HEAR Score: 4                    Given the large differential diagnosis for Julia Combs, the decision making in this case is of high complexity. I personally ordered interpreted and reviewed labs and imaging including CBC which shows mild thrombocytosis of insignificant value, troponin within normal limits x2, BMP without abnormality.  Chest x-ray shows no acute abnormalities.  EKG shows normal sinus rhythm at a rate of 97.  The EKG is abnormal and appears to have anterior Q waves concerning for previous infarct.  Patient is advised to follow closely with cardiology for evaluation. After evaluating all of the data points in this case, the presentation of Julia Combs is NOT consistent with Acute Coronary Syndrome (ACS) and/or myocardial ischemia, pulmonary embolism, aortic dissection; Borhaave's, significant arrythmia, pneumothorax, cardiac tamponade, or other emergent cardiopulmonary condition.  Further, the presentation of Julia Combs is NOT consistent with pericarditis, myocarditis, cholecystitis, pancreatitis, mediastinitis, endocarditis, new valvular disease.  Additionally, the presentation of Julia Combs NOT consistent with flail chest, cardiac contusion, ARDS, or significant intra-thoracic or intra-abdominal bleeding.  Moreover, this presentation is NOT consistent with pneumonia, sepsis, or pyelonephritis.     Strict return and follow-up precautions have been given by me personally or by detailed written instruction given verbally by nursing staff using the teach back method to the patient/family/caregiver(s).  Data Reviewed/Counseling: I have reviewed the patient's vital signs, nursing notes, and other  relevant tests/information. I had a detailed discussion regarding the historical points, exam findings, and any diagnostic results supporting the discharge diagnosis. I also discussed the need for outpatient follow-up and the need  to return to the ED if symptoms worsen or if there are any questions or concerns that arise at home.  Final Clinical Impression(s) / ED Diagnoses Final diagnoses:  Chest wall pain  Abnormal ECG    Rx / DC Orders ED Discharge Orders    None       Margarita Mail, PA-C 11/05/19 2042    Margette Fast, MD 11/11/19 [phone]

## 2019-11-05 NOTE — Discharge Instructions (Addendum)
You have been diagnosed by your caregiver as having chest wall pain. °SEEK IMMEDIATE MEDICAL ATTENTION IF: °You develop a fever.  °Your chest pains become severe or intolerable.  °You develop new, unexplained symptoms (problems).  °You develop shortness of breath, nausea, vomiting, sweating or feel light headed.  °You develop a new cough or you cough up blood. ° °

## 2019-11-09 DIAGNOSIS — R0789 Other chest pain: Secondary | ICD-10-CM | POA: Diagnosis not present

## 2019-11-09 DIAGNOSIS — Z720 Tobacco use: Secondary | ICD-10-CM | POA: Diagnosis not present

## 2019-11-30 DIAGNOSIS — E119 Type 2 diabetes mellitus without complications: Secondary | ICD-10-CM | POA: Diagnosis not present

## 2019-11-30 DIAGNOSIS — E785 Hyperlipidemia, unspecified: Secondary | ICD-10-CM | POA: Diagnosis not present

## 2019-11-30 DIAGNOSIS — Z7984 Long term (current) use of oral hypoglycemic drugs: Secondary | ICD-10-CM | POA: Diagnosis not present

## 2020-01-19 DIAGNOSIS — Z1231 Encounter for screening mammogram for malignant neoplasm of breast: Secondary | ICD-10-CM | POA: Diagnosis not present

## 2020-02-22 IMAGING — CT CT ABDOMEN AND PELVIS WITH CONTRAST
2 of 5 series · 16 of 46 positions shown, 18 images · IV contrast (APPLIED)
Comparison: None.

CLINICAL DATA: Nausea, vomiting, and diarrhea

EXAM:
CT ABDOMEN AND PELVIS WITH CONTRAST
TECHNIQUE: Multidetector CT imaging of the abdomen and pelvis was performed
using the standard protocol following bolus administration of
intravenous contrast.
CONTRAST:  100mL OMNIPAQUE IOHEXOL 300 MG/ML  SOLN

[Series 2: axial st · axial · 0.71mm/px · z∈[+793,+1178]mm · 13 of 87 slices shown, 15 images]
[im 5/87  soft-tissue]
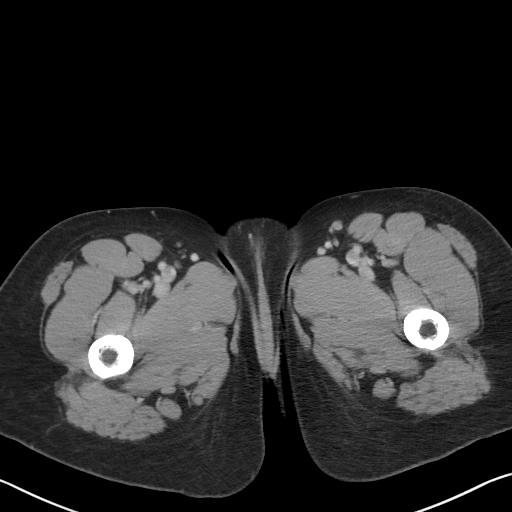
[im 5/87  bone]
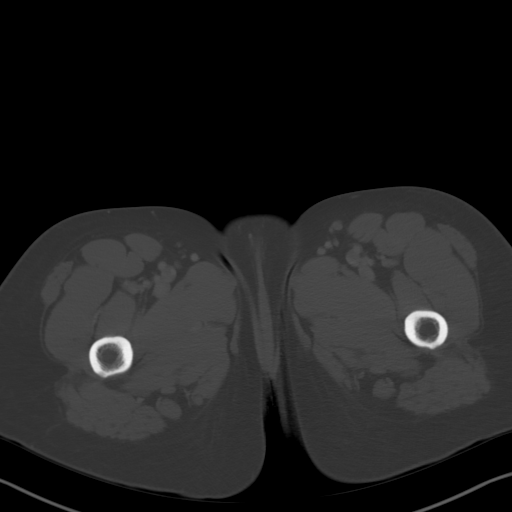
[im 10/87  soft-tissue]
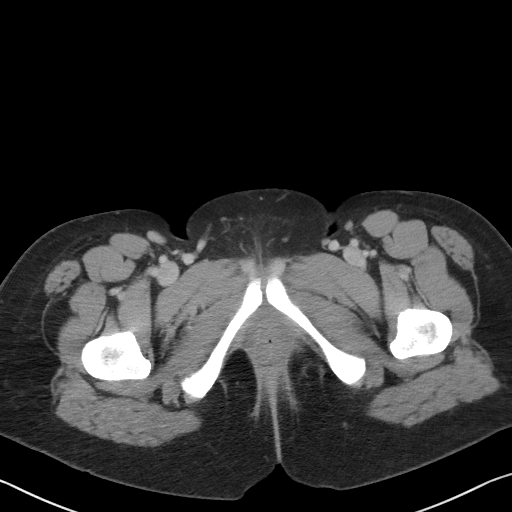
[im 20/87  soft-tissue]
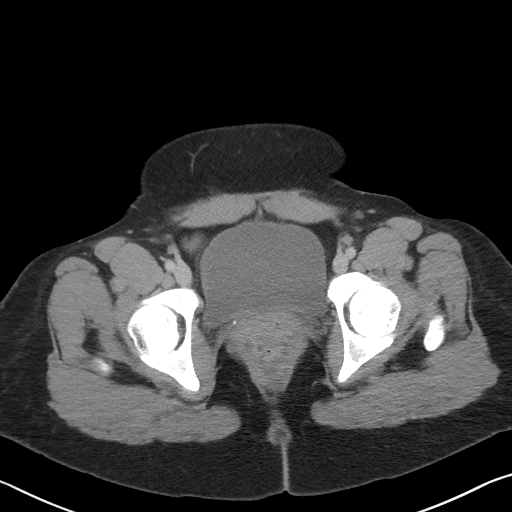
[im 24/87  soft-tissue]
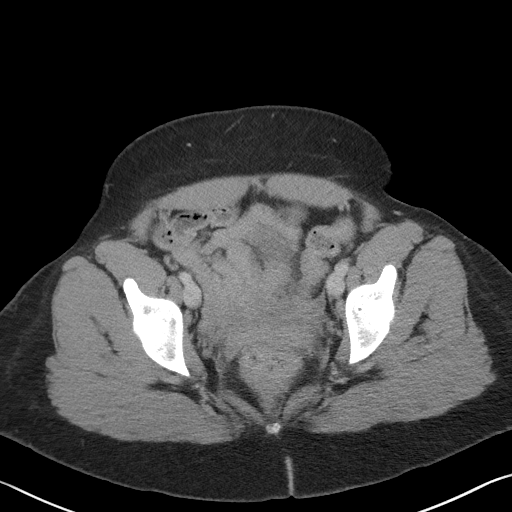
[im 29/87  soft-tissue]
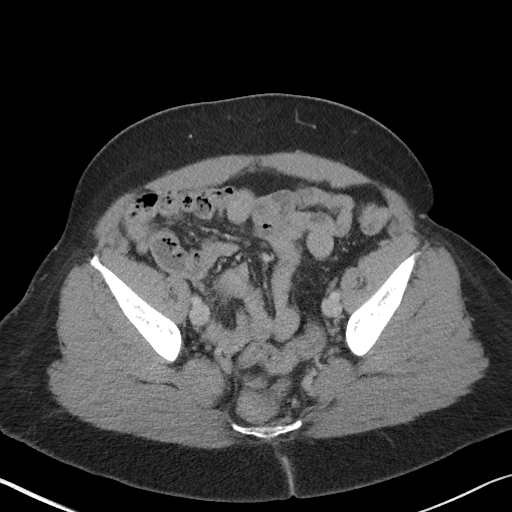
[im 39/87  soft-tissue]
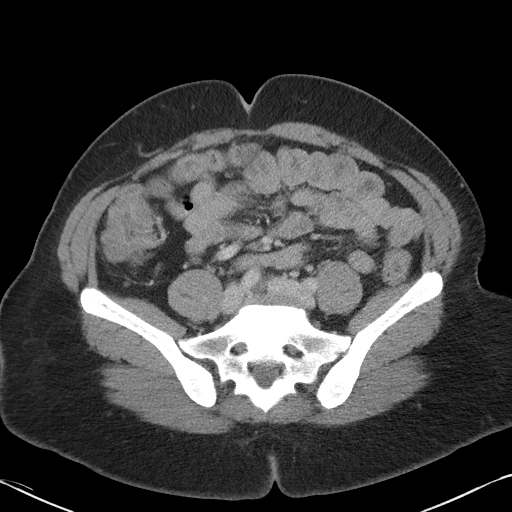
[im 44/87  soft-tissue]
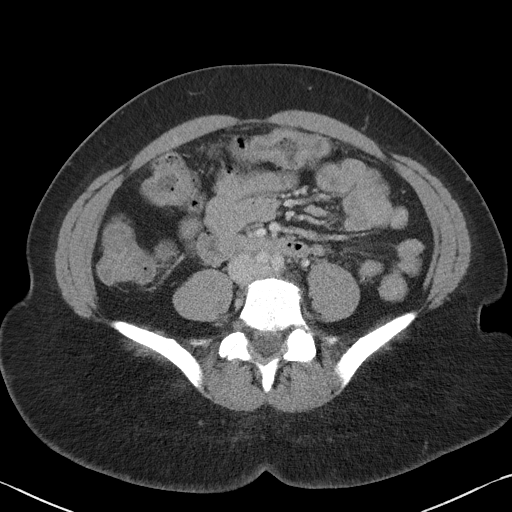
[im 48/87  soft-tissue]
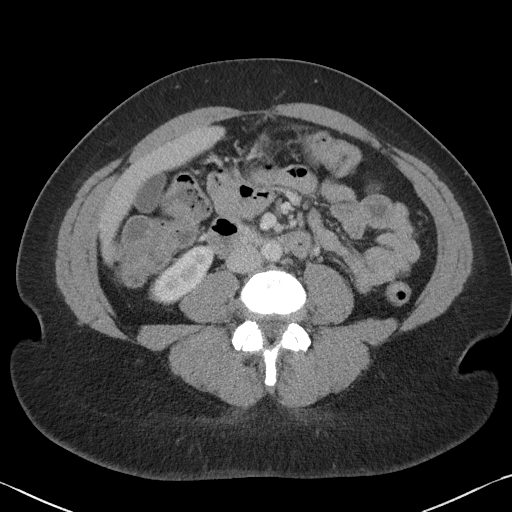
[im 58/87  soft-tissue]
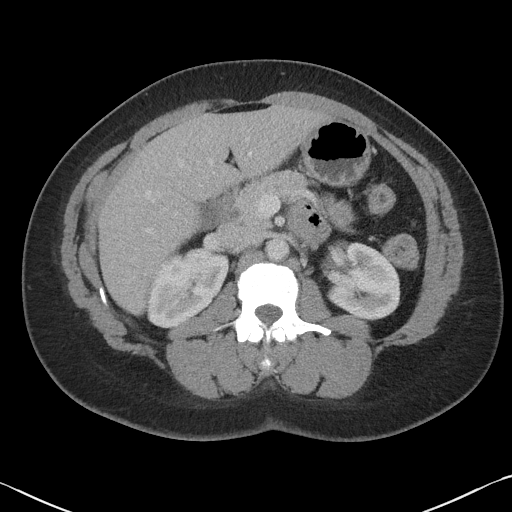
[im 58/87  bone]
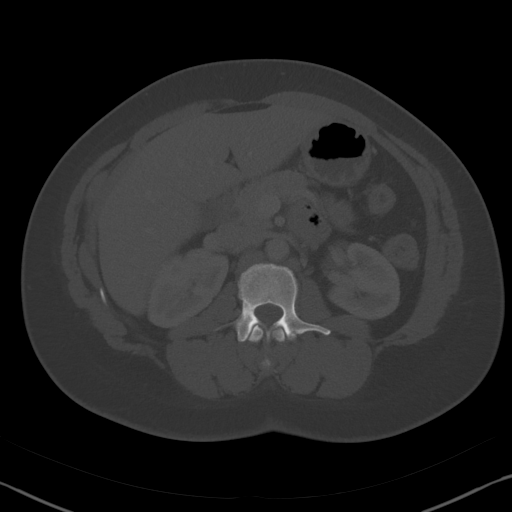
[im 63/87  soft-tissue]
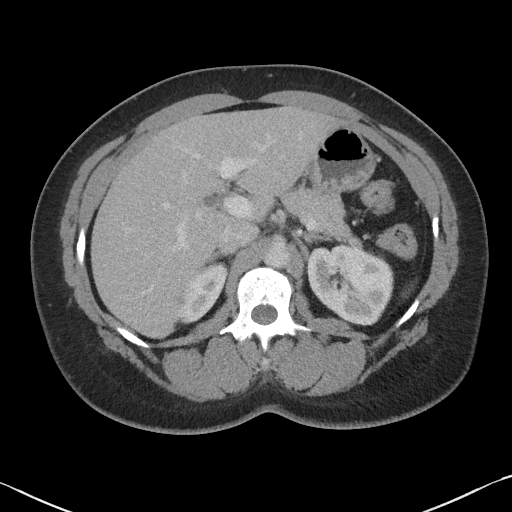
[im 67/87  soft-tissue]
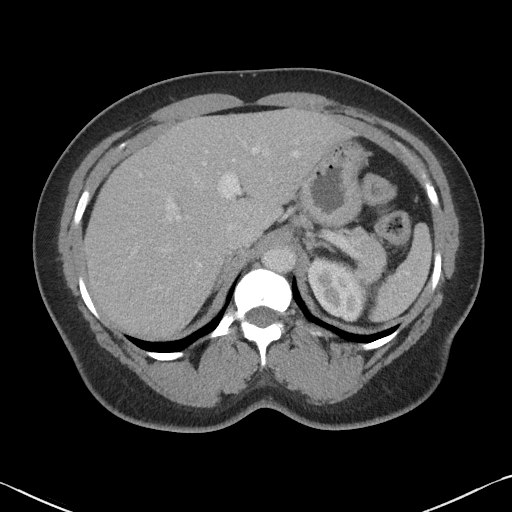
[im 77/87  soft-tissue]
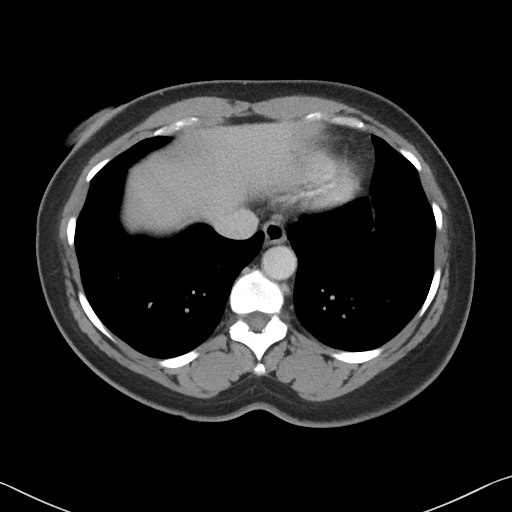
[im 82/87  soft-tissue]
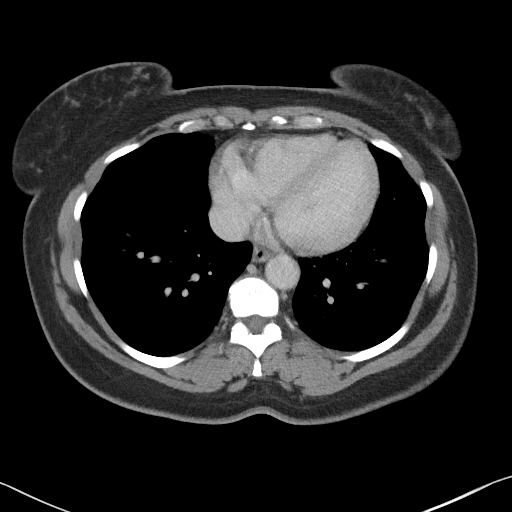

[Series 5: coronal st · coronal · 0.70mm/px · 3 of 104 slices shown]
[im 35/104  soft-tissue]
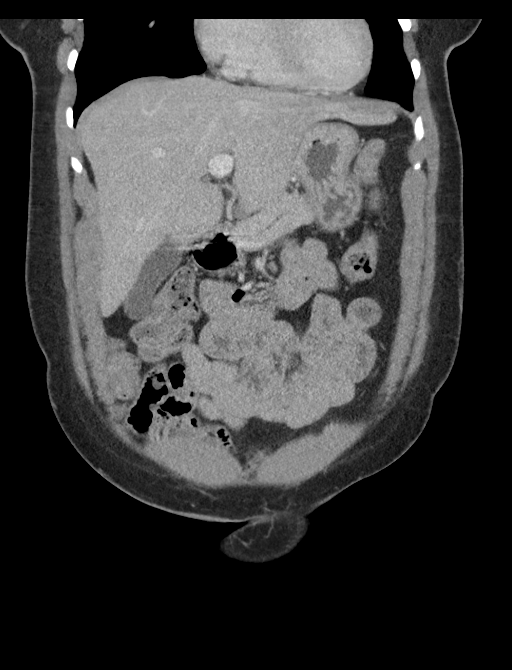
[im 46/104  soft-tissue]
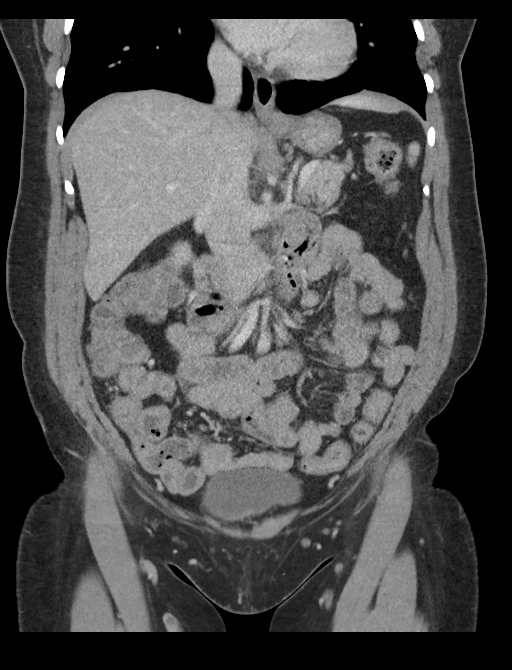
[im 58/104  soft-tissue]
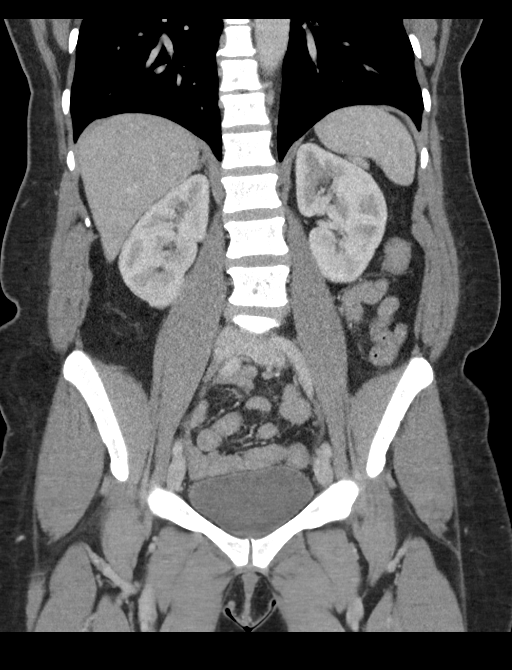

[16 of 46 positions shown; findings below may reference images not displayed]

FINDINGS: Lower chest: Lung bases are clear.

Hepatobiliary: No focal liver lesions are evident. Gallbladder wall
is not appreciably thickened. There is no biliary duct dilatation.

Pancreas: There is no pancreatic mass or inflammatory focus.

Spleen: No splenic lesions are evident.

Adrenals/Urinary Tract: Adrenals bilaterally appear normal. Kidneys
bilaterally show no evident mass or hydronephrosis on either side.
There is no evident renal or ureteral calculus on either side.
Urinary bladder is midline with wall thickness within normal limits.

Stomach/Bowel: There is slight generalized bowel wall thickening
throughout much of the jejunum. No evident bowel obstruction.
Terminal ileum appears unremarkable. No free air or portal venous
air evident.

Vascular/Lymphatic: No abdominal aortic aneurysm. No vascular
lesions are demonstrable. No adenopathy is appreciable in the
abdomen or pelvis by size criteria. There are subcentimeter inguinal
lymph nodes, considered nonspecific.

Reproductive: Uterus is absent.  No evident pelvic mass.

Other: There is no periappendiceal region inflammation. Appendix not
seen. No evident abscess or ascites in the abdomen or pelvis.

Musculoskeletal: There are no blastic or lytic bone lesions. No
intramuscular or abdominal wall lesions are evident.
IMPRESSION: 1. There are loops of mildly dilated jejunum, likely due to a degree
of enteritis. No bowel obstruction evident.
2. No abscess in the abdomen or pelvis. No periappendiceal region
inflammation.
3. No renal or ureteral calculus. No hydronephrosis. Urinary bladder
wall thickness within normal limits.

## 2020-03-02 DIAGNOSIS — Z20822 Contact with and (suspected) exposure to covid-19: Secondary | ICD-10-CM | POA: Diagnosis not present

## 2020-03-02 DIAGNOSIS — R112 Nausea with vomiting, unspecified: Secondary | ICD-10-CM | POA: Diagnosis not present

## 2020-03-02 DIAGNOSIS — R519 Headache, unspecified: Secondary | ICD-10-CM | POA: Diagnosis not present

## 2020-03-02 DIAGNOSIS — R110 Nausea: Secondary | ICD-10-CM | POA: Diagnosis not present

## 2020-03-02 DIAGNOSIS — R197 Diarrhea, unspecified: Secondary | ICD-10-CM | POA: Diagnosis not present

## 2020-04-11 DIAGNOSIS — R69 Illness, unspecified: Secondary | ICD-10-CM | POA: Diagnosis not present

## 2020-04-11 DIAGNOSIS — E785 Hyperlipidemia, unspecified: Secondary | ICD-10-CM | POA: Diagnosis not present

## 2020-04-11 DIAGNOSIS — Z23 Encounter for immunization: Secondary | ICD-10-CM | POA: Diagnosis not present

## 2020-04-11 DIAGNOSIS — Z1231 Encounter for screening mammogram for malignant neoplasm of breast: Secondary | ICD-10-CM | POA: Diagnosis not present

## 2020-04-11 DIAGNOSIS — D75839 Thrombocytosis, unspecified: Secondary | ICD-10-CM | POA: Diagnosis not present

## 2020-04-11 DIAGNOSIS — Z0000 Encounter for general adult medical examination without abnormal findings: Secondary | ICD-10-CM | POA: Diagnosis not present

## 2020-04-11 DIAGNOSIS — Z9119 Patient's noncompliance with other medical treatment and regimen: Secondary | ICD-10-CM | POA: Diagnosis not present

## 2020-04-11 DIAGNOSIS — Z6831 Body mass index (BMI) 31.0-31.9, adult: Secondary | ICD-10-CM | POA: Diagnosis not present

## 2020-04-11 DIAGNOSIS — E6609 Other obesity due to excess calories: Secondary | ICD-10-CM | POA: Diagnosis not present

## 2020-04-11 DIAGNOSIS — E611 Iron deficiency: Secondary | ICD-10-CM | POA: Diagnosis not present

## 2020-04-11 DIAGNOSIS — Z1211 Encounter for screening for malignant neoplasm of colon: Secondary | ICD-10-CM | POA: Diagnosis not present

## 2020-04-11 DIAGNOSIS — E119 Type 2 diabetes mellitus without complications: Secondary | ICD-10-CM | POA: Diagnosis not present

## 2020-06-06 DIAGNOSIS — E785 Hyperlipidemia, unspecified: Secondary | ICD-10-CM | POA: Diagnosis not present

## 2020-06-06 DIAGNOSIS — Z0001 Encounter for general adult medical examination with abnormal findings: Secondary | ICD-10-CM | POA: Diagnosis not present

## 2020-06-06 DIAGNOSIS — B009 Herpesviral infection, unspecified: Secondary | ICD-10-CM | POA: Diagnosis not present

## 2020-06-06 DIAGNOSIS — Z7984 Long term (current) use of oral hypoglycemic drugs: Secondary | ICD-10-CM | POA: Diagnosis not present

## 2020-06-06 DIAGNOSIS — Z6831 Body mass index (BMI) 31.0-31.9, adult: Secondary | ICD-10-CM | POA: Diagnosis not present

## 2020-06-06 DIAGNOSIS — E119 Type 2 diabetes mellitus without complications: Secondary | ICD-10-CM | POA: Diagnosis not present

## 2020-06-06 DIAGNOSIS — E6609 Other obesity due to excess calories: Secondary | ICD-10-CM | POA: Diagnosis not present

## 2020-06-06 DIAGNOSIS — R69 Illness, unspecified: Secondary | ICD-10-CM | POA: Diagnosis not present

## 2020-06-06 DIAGNOSIS — N951 Menopausal and female climacteric states: Secondary | ICD-10-CM | POA: Diagnosis not present

## 2020-06-06 DIAGNOSIS — F5101 Primary insomnia: Secondary | ICD-10-CM | POA: Diagnosis not present

## 2020-06-06 DIAGNOSIS — Z79899 Other long term (current) drug therapy: Secondary | ICD-10-CM | POA: Diagnosis not present

## 2020-06-06 DIAGNOSIS — D75839 Thrombocytosis, unspecified: Secondary | ICD-10-CM | POA: Diagnosis not present

## 2020-07-18 DIAGNOSIS — L658 Other specified nonscarring hair loss: Secondary | ICD-10-CM | POA: Diagnosis not present

## 2020-07-18 DIAGNOSIS — L301 Dyshidrosis [pompholyx]: Secondary | ICD-10-CM | POA: Diagnosis not present

## 2020-07-18 DIAGNOSIS — B079 Viral wart, unspecified: Secondary | ICD-10-CM | POA: Diagnosis not present

## 2020-08-03 DIAGNOSIS — Z20822 Contact with and (suspected) exposure to covid-19: Secondary | ICD-10-CM | POA: Diagnosis not present

## 2020-08-03 DIAGNOSIS — J069 Acute upper respiratory infection, unspecified: Secondary | ICD-10-CM | POA: Diagnosis not present

## 2020-08-03 DIAGNOSIS — J029 Acute pharyngitis, unspecified: Secondary | ICD-10-CM | POA: Diagnosis not present

## 2020-08-03 DIAGNOSIS — H6981 Other specified disorders of Eustachian tube, right ear: Secondary | ICD-10-CM | POA: Diagnosis not present

## 2020-08-31 DIAGNOSIS — H2513 Age-related nuclear cataract, bilateral: Secondary | ICD-10-CM | POA: Diagnosis not present

## 2020-08-31 DIAGNOSIS — H524 Presbyopia: Secondary | ICD-10-CM | POA: Diagnosis not present

## 2020-08-31 DIAGNOSIS — H52201 Unspecified astigmatism, right eye: Secondary | ICD-10-CM | POA: Diagnosis not present

## 2020-08-31 DIAGNOSIS — Z7984 Long term (current) use of oral hypoglycemic drugs: Secondary | ICD-10-CM | POA: Diagnosis not present

## 2020-08-31 DIAGNOSIS — E119 Type 2 diabetes mellitus without complications: Secondary | ICD-10-CM | POA: Diagnosis not present

## 2020-08-31 DIAGNOSIS — H43393 Other vitreous opacities, bilateral: Secondary | ICD-10-CM | POA: Diagnosis not present

## 2020-09-21 DIAGNOSIS — Z20822 Contact with and (suspected) exposure to covid-19: Secondary | ICD-10-CM | POA: Diagnosis not present

## 2020-09-22 DIAGNOSIS — Z20822 Contact with and (suspected) exposure to covid-19: Secondary | ICD-10-CM | POA: Diagnosis not present

## 2020-09-22 DIAGNOSIS — B349 Viral infection, unspecified: Secondary | ICD-10-CM | POA: Diagnosis not present

## 2020-10-10 DIAGNOSIS — Z1211 Encounter for screening for malignant neoplasm of colon: Secondary | ICD-10-CM | POA: Diagnosis not present

## 2020-10-10 DIAGNOSIS — K649 Unspecified hemorrhoids: Secondary | ICD-10-CM | POA: Diagnosis not present

## 2020-10-10 DIAGNOSIS — D123 Benign neoplasm of transverse colon: Secondary | ICD-10-CM | POA: Diagnosis not present

## 2020-10-10 DIAGNOSIS — K648 Other hemorrhoids: Secondary | ICD-10-CM | POA: Diagnosis not present

## 2020-10-10 DIAGNOSIS — K621 Rectal polyp: Secondary | ICD-10-CM | POA: Diagnosis not present

## 2020-10-10 DIAGNOSIS — K635 Polyp of colon: Secondary | ICD-10-CM | POA: Diagnosis not present

## 2020-10-26 DIAGNOSIS — J069 Acute upper respiratory infection, unspecified: Secondary | ICD-10-CM | POA: Diagnosis not present

## 2020-10-26 DIAGNOSIS — L658 Other specified nonscarring hair loss: Secondary | ICD-10-CM | POA: Diagnosis not present

## 2020-10-26 DIAGNOSIS — G43009 Migraine without aura, not intractable, without status migrainosus: Secondary | ICD-10-CM | POA: Diagnosis not present

## 2020-10-26 DIAGNOSIS — D75839 Thrombocytosis, unspecified: Secondary | ICD-10-CM | POA: Diagnosis not present

## 2020-10-26 DIAGNOSIS — E119 Type 2 diabetes mellitus without complications: Secondary | ICD-10-CM | POA: Diagnosis not present

## 2020-10-26 DIAGNOSIS — E785 Hyperlipidemia, unspecified: Secondary | ICD-10-CM | POA: Diagnosis not present

## 2020-10-26 DIAGNOSIS — E6609 Other obesity due to excess calories: Secondary | ICD-10-CM | POA: Diagnosis not present

## 2020-10-26 DIAGNOSIS — R69 Illness, unspecified: Secondary | ICD-10-CM | POA: Diagnosis not present

## 2020-10-26 DIAGNOSIS — B079 Viral wart, unspecified: Secondary | ICD-10-CM | POA: Diagnosis not present

## 2020-10-26 DIAGNOSIS — L301 Dyshidrosis [pompholyx]: Secondary | ICD-10-CM | POA: Diagnosis not present

## 2020-10-28 DIAGNOSIS — Z20822 Contact with and (suspected) exposure to covid-19: Secondary | ICD-10-CM | POA: Diagnosis not present

## 2020-11-21 DIAGNOSIS — E119 Type 2 diabetes mellitus without complications: Secondary | ICD-10-CM | POA: Diagnosis not present

## 2020-11-21 DIAGNOSIS — E785 Hyperlipidemia, unspecified: Secondary | ICD-10-CM | POA: Diagnosis not present

## 2021-01-09 DIAGNOSIS — D170 Benign lipomatous neoplasm of skin and subcutaneous tissue of head, face and neck: Secondary | ICD-10-CM | POA: Diagnosis not present

## 2021-01-09 DIAGNOSIS — E669 Obesity, unspecified: Secondary | ICD-10-CM | POA: Diagnosis not present

## 2021-01-23 DIAGNOSIS — Z1231 Encounter for screening mammogram for malignant neoplasm of breast: Secondary | ICD-10-CM | POA: Diagnosis not present

## 2021-03-15 DIAGNOSIS — R509 Fever, unspecified: Secondary | ICD-10-CM | POA: Diagnosis not present

## 2021-03-15 DIAGNOSIS — J101 Influenza due to other identified influenza virus with other respiratory manifestations: Secondary | ICD-10-CM | POA: Diagnosis not present

## 2021-03-15 DIAGNOSIS — Z20822 Contact with and (suspected) exposure to covid-19: Secondary | ICD-10-CM | POA: Diagnosis not present

## 2021-03-15 DIAGNOSIS — R69 Illness, unspecified: Secondary | ICD-10-CM | POA: Diagnosis not present

## 2021-03-15 DIAGNOSIS — R110 Nausea: Secondary | ICD-10-CM | POA: Diagnosis not present

## 2021-03-15 DIAGNOSIS — R197 Diarrhea, unspecified: Secondary | ICD-10-CM | POA: Diagnosis not present

## 2021-03-15 DIAGNOSIS — M791 Myalgia, unspecified site: Secondary | ICD-10-CM | POA: Diagnosis not present

## 2021-03-15 DIAGNOSIS — R059 Cough, unspecified: Secondary | ICD-10-CM | POA: Diagnosis not present

## 2021-04-17 DIAGNOSIS — Z20822 Contact with and (suspected) exposure to covid-19: Secondary | ICD-10-CM | POA: Diagnosis not present

## 2021-04-17 DIAGNOSIS — R051 Acute cough: Secondary | ICD-10-CM | POA: Diagnosis not present

## 2021-04-17 DIAGNOSIS — R509 Fever, unspecified: Secondary | ICD-10-CM | POA: Diagnosis not present

## 2021-04-17 DIAGNOSIS — J101 Influenza due to other identified influenza virus with other respiratory manifestations: Secondary | ICD-10-CM | POA: Diagnosis not present

## 2021-05-30 ENCOUNTER — Other Ambulatory Visit: Payer: Self-pay

## 2021-05-30 ENCOUNTER — Encounter (HOSPITAL_BASED_OUTPATIENT_CLINIC_OR_DEPARTMENT_OTHER): Payer: Self-pay | Admitting: Plastic Surgery

## 2021-06-05 ENCOUNTER — Encounter (HOSPITAL_BASED_OUTPATIENT_CLINIC_OR_DEPARTMENT_OTHER)
Admission: RE | Admit: 2021-06-05 | Discharge: 2021-06-05 | Disposition: A | Discharge status: Home / Self Care | Payer: 59 | Source: Ambulatory Visit | Attending: Plastic Surgery | Admitting: Plastic Surgery

## 2021-06-05 ENCOUNTER — Encounter (HOSPITAL_BASED_OUTPATIENT_CLINIC_OR_DEPARTMENT_OTHER): Payer: Self-pay

## 2021-06-05 ENCOUNTER — Other Ambulatory Visit: Payer: Self-pay

## 2021-06-05 ENCOUNTER — Encounter (HOSPITAL_BASED_OUTPATIENT_CLINIC_OR_DEPARTMENT_OTHER): Payer: 59

## 2021-06-05 DIAGNOSIS — Z01810 Encounter for preprocedural cardiovascular examination: Secondary | ICD-10-CM | POA: Insufficient documentation

## 2021-06-05 NOTE — Anesthesia Preprocedure Evaluation (Addendum)
Anesthesia Evaluation  Patient identified by MRN, date of birth, ID band Patient awake    Reviewed: Allergy & Precautions, NPO status , Patient's Chart, lab work & pertinent test results  Airway Mallampati: II  TM Distance: >3 FB Neck ROM: Full    Dental no notable dental hx.    Pulmonary neg pulmonary ROS, Current Smoker and Patient abstained from smoking.,    Pulmonary exam normal breath sounds clear to auscultation       Cardiovascular Exercise Tolerance: Good negative cardio ROS Normal cardiovascular exam Rhythm:Regular Rate:Normal     Neuro/Psych  Headaches, PSYCHIATRIC DISORDERS Depression negative neurological ROS  negative psych ROS   GI/Hepatic negative GI ROS, Neg liver ROS,   Endo/Other  negative endocrine ROSdiabetes, Type 2, Oral Hypoglycemic Agents  Renal/GU negative Renal ROS  negative genitourinary   Musculoskeletal negative musculoskeletal ROS (+)   Abdominal   Peds negative pediatric ROS (+)  Hematology negative hematology ROS (+)   Anesthesia Other Findings   Reproductive/Obstetrics negative OB ROS                            Anesthesia Physical Anesthesia Plan  ASA: 2  Anesthesia Plan: General   Post-op Pain Management: Tylenol PO (pre-op)   Induction: Intravenous  PONV Risk Score and Plan: 2 and Ondansetron, Dexamethasone, Midazolam, Scopolamine patch - Pre-op and Treatment may vary due to age or medical condition  Airway Management Planned: Oral ETT  Additional Equipment: None  Intra-op Plan:   Post-operative Plan: Extubation in OR  Informed Consent: I have reviewed the patients History and Physical, chart, labs and discussed the procedure including the risks, benefits and alternatives for the proposed anesthesia with the patient or authorized representative who has indicated his/her understanding and acceptance.     Dental advisory given  Plan  Discussed with: Anesthesiologist, CRNA and Surgeon  Anesthesia Plan Comments:        Anesthesia Quick Evaluation

## 2021-06-05 NOTE — Progress Notes (Signed)
° ° ° ° °  Enhanced Recovery after Surgery for Orthopedics Enhanced Recovery after Surgery is a protocol used to improve the stress on your body and your recovery after surgery.  Patient Instructions  The night before surgery:  No food after midnight. ONLY clear liquids after midnight  The day of surgery (if you do NOT have diabetes):  Drink ONE (1) Pre-Surgery Clear Ensure as directed.   This drink was given to you during your hospital  pre-op appointment visit. The pre-op nurse will instruct you on the time to drink the  Pre-Surgery Ensure depending on your surgery time. Finish the drink at the designated time by the pre-op nurse.  Nothing else to drink after completing the  Pre-Surgery Clear Ensure.  The day of surgery (if you have diabetes): Drink ONE (1) Gatorade 2 (G2) as directed. This drink was given to you during your hospital  pre-op appointment visit.  The pre-op nurse will instruct you on the time to drink the   Gatorade 2 (G2) depending on your surgery time. Color of the Gatorade may vary. Red is not allowed. Nothing else to drink after completing the  Gatorade 2 (G2).         If you have questions, please contact your surgeons office.  Surgical soap given with instructions. Patient verbalized understanding.

## 2021-06-06 ENCOUNTER — Encounter (HOSPITAL_BASED_OUTPATIENT_CLINIC_OR_DEPARTMENT_OTHER)
Admission: RE | Disposition: A | Discharge status: Home / Self Care | Payer: Self-pay | Source: Home / Self Care | Attending: Plastic Surgery

## 2021-06-06 ENCOUNTER — Ambulatory Visit (HOSPITAL_BASED_OUTPATIENT_CLINIC_OR_DEPARTMENT_OTHER)
Admission: RE | Admit: 2021-06-06 | Discharge: 2021-06-06 | Disposition: A | Discharge status: Home / Self Care | Payer: 59 | Attending: Plastic Surgery | Admitting: Plastic Surgery

## 2021-06-06 ENCOUNTER — Ambulatory Visit (HOSPITAL_BASED_OUTPATIENT_CLINIC_OR_DEPARTMENT_OTHER): Payer: 59 | Admitting: Anesthesiology

## 2021-06-06 ENCOUNTER — Other Ambulatory Visit: Payer: Self-pay

## 2021-06-06 ENCOUNTER — Encounter (HOSPITAL_BASED_OUTPATIENT_CLINIC_OR_DEPARTMENT_OTHER): Payer: Self-pay | Admitting: Plastic Surgery

## 2021-06-06 DIAGNOSIS — E119 Type 2 diabetes mellitus without complications: Secondary | ICD-10-CM

## 2021-06-06 DIAGNOSIS — F17200 Nicotine dependence, unspecified, uncomplicated: Secondary | ICD-10-CM | POA: Insufficient documentation

## 2021-06-06 DIAGNOSIS — Z7984 Long term (current) use of oral hypoglycemic drugs: Secondary | ICD-10-CM | POA: Diagnosis not present

## 2021-06-06 DIAGNOSIS — D170 Benign lipomatous neoplasm of skin and subcutaneous tissue of head, face and neck: Secondary | ICD-10-CM | POA: Diagnosis not present

## 2021-06-06 DIAGNOSIS — R221 Localized swelling, mass and lump, neck: Secondary | ICD-10-CM | POA: Diagnosis not present

## 2021-06-06 DIAGNOSIS — R69 Illness, unspecified: Secondary | ICD-10-CM | POA: Diagnosis not present

## 2021-06-06 HISTORY — PX: EXCISION MASS NECK: SHX6703

## 2021-06-06 LAB — BASIC METABOLIC PANEL
Anion gap: 11 (ref 5–15)
BUN: 8 mg/dL (ref 6–20)
CO2: 21 mmol/L — ABNORMAL LOW (ref 22–32)
Calcium: 8.9 mg/dL (ref 8.9–10.3)
Chloride: 105 mmol/L (ref 98–111)
Creatinine, Ser: 0.61 mg/dL (ref 0.44–1.00)
GFR, Estimated: >60 mL/min (ref >60)
Glucose, Bld: 119 mg/dL — ABNORMAL HIGH (ref 70–99)
Potassium: 4 mmol/L (ref 3.5–5.1)
Sodium: 137 mmol/L (ref 135–145)

## 2021-06-06 LAB — GLUCOSE, CAPILLARY
Glucose-Capillary: 116 mg/dL — ABNORMAL HIGH (ref 70–99)
Glucose-Capillary: 172 mg/dL — ABNORMAL HIGH (ref 70–99)

## 2021-06-06 SURGERY — EXCISION, MASS, NECK
Anesthesia: General | Site: Neck

## 2021-06-06 MED ORDER — ACETAMINOPHEN 500 MG PO TABS
ORAL_TABLET | ORAL | Status: AC
  Filled 2021-06-06: qty 2

## 2021-06-06 MED ORDER — CEFAZOLIN SODIUM-DEXTROSE 2-4 GM/100ML-% IV SOLN
INTRAVENOUS | Status: AC
  Filled 2021-06-06: qty 100

## 2021-06-06 MED ORDER — SUGAMMADEX SODIUM 200 MG/2ML IV SOLN
INTRAVENOUS | Status: DC | PRN
  Administered 2021-06-06: 200 mg via INTRAVENOUS

## 2021-06-06 MED ORDER — LIDOCAINE 2% (20 MG/ML) 5 ML SYRINGE
INTRAMUSCULAR | Status: AC
  Filled 2021-06-06: qty 5

## 2021-06-06 MED ORDER — CHLORHEXIDINE GLUCONATE CLOTH 2 % EX PADS: 6.0000 | MEDICATED_PAD | Freq: Once | CUTANEOUS | Status: DC

## 2021-06-06 MED ORDER — OXYCODONE HCL 5 MG/5ML PO SOLN: 5.0000 mg | Freq: Once | ORAL | Status: DC | PRN

## 2021-06-06 MED ORDER — DROPERIDOL 2.5 MG/ML IJ SOLN: 0.6250 mg | Freq: Once | INTRAMUSCULAR | Status: DC | PRN

## 2021-06-06 MED ORDER — LACTATED RINGERS IV SOLN: INTRAVENOUS | Status: DC

## 2021-06-06 MED ORDER — ACETAMINOPHEN 500 MG PO TABS
1000.0000 mg | ORAL_TABLET | ORAL | Status: AC
  Administered 2021-06-06: 07:00:00 1000 mg via ORAL

## 2021-06-06 MED ORDER — ROCURONIUM BROMIDE 100 MG/10ML IV SOLN
INTRAVENOUS | Status: DC | PRN
  Administered 2021-06-06: 70 mg via INTRAVENOUS

## 2021-06-06 MED ORDER — MIDAZOLAM HCL 5 MG/5ML IJ SOLN
INTRAMUSCULAR | Status: DC | PRN
  Administered 2021-06-06: 2 mg via INTRAVENOUS

## 2021-06-06 MED ORDER — CELECOXIB 200 MG PO CAPS
200.0000 mg | ORAL_CAPSULE | ORAL | Status: AC
  Administered 2021-06-06: 07:00:00 200 mg via ORAL

## 2021-06-06 MED ORDER — GABAPENTIN 300 MG PO CAPS
ORAL_CAPSULE | ORAL | Status: AC
  Filled 2021-06-06: qty 1

## 2021-06-06 MED ORDER — ACETAMINOPHEN 500 MG PO TABS: 1000.0000 mg | ORAL_TABLET | Freq: Once | ORAL | Status: AC

## 2021-06-06 MED ORDER — SCOPOLAMINE 1 MG/3DAYS TD PT72
1.0000 | MEDICATED_PATCH | TRANSDERMAL | Status: DC
  Administered 2021-06-06: 07:00:00 1.5 mg via TRANSDERMAL

## 2021-06-06 MED ORDER — GABAPENTIN 300 MG PO CAPS
300.0000 mg | ORAL_CAPSULE | ORAL | Status: AC
  Administered 2021-06-06: 07:00:00 300 mg via ORAL

## 2021-06-06 MED ORDER — MIDAZOLAM HCL 2 MG/2ML IJ SOLN
INTRAMUSCULAR | Status: AC
  Filled 2021-06-06: qty 2

## 2021-06-06 MED ORDER — DEXAMETHASONE SODIUM PHOSPHATE 10 MG/ML IJ SOLN
INTRAMUSCULAR | Status: AC
  Filled 2021-06-06: qty 1

## 2021-06-06 MED ORDER — BUPIVACAINE HCL (PF) 0.5 % IJ SOLN
INTRAMUSCULAR | Status: AC
  Filled 2021-06-06: qty 30

## 2021-06-06 MED ORDER — 0.9 % SODIUM CHLORIDE (POUR BTL) OPTIME
TOPICAL | Status: DC | PRN
  Administered 2021-06-06: 08:00:00 200 mL

## 2021-06-06 MED ORDER — PROPOFOL 500 MG/50ML IV EMUL
INTRAVENOUS | Status: AC
  Filled 2021-06-06: qty 50

## 2021-06-06 MED ORDER — FENTANYL CITRATE (PF) 100 MCG/2ML IJ SOLN: 25.0000 ug | INTRAMUSCULAR | Status: DC | PRN

## 2021-06-06 MED ORDER — CELECOXIB 200 MG PO CAPS
ORAL_CAPSULE | ORAL | Status: AC
  Filled 2021-06-06: qty 1

## 2021-06-06 MED ORDER — LIDOCAINE HCL (CARDIAC) PF 100 MG/5ML IV SOSY
PREFILLED_SYRINGE | INTRAVENOUS | Status: DC | PRN
  Administered 2021-06-06: 80 mg via INTRAVENOUS

## 2021-06-06 MED ORDER — BUPIVACAINE-EPINEPHRINE (PF) 0.25% -1:200000 IJ SOLN
INTRAMUSCULAR | Status: AC
  Filled 2021-06-06: qty 30

## 2021-06-06 MED ORDER — OXYCODONE-ACETAMINOPHEN 5-325 MG PO TABS: 1.0000 | ORAL_TABLET | ORAL | 0 refills | Status: AC | PRN

## 2021-06-06 MED ORDER — CEFAZOLIN SODIUM-DEXTROSE 2-4 GM/100ML-% IV SOLN
2.0000 g | INTRAVENOUS | Status: AC
  Administered 2021-06-06: 08:00:00 2 g via INTRAVENOUS

## 2021-06-06 MED ORDER — SCOPOLAMINE 1 MG/3DAYS TD PT72
MEDICATED_PATCH | TRANSDERMAL | Status: AC
  Filled 2021-06-06: qty 1

## 2021-06-06 MED ORDER — FENTANYL CITRATE (PF) 100 MCG/2ML IJ SOLN
INTRAMUSCULAR | Status: DC | PRN
  Administered 2021-06-06 (×4): 50 ug via INTRAVENOUS

## 2021-06-06 MED ORDER — FENTANYL CITRATE (PF) 100 MCG/2ML IJ SOLN
INTRAMUSCULAR | Status: AC
  Filled 2021-06-06: qty 2

## 2021-06-06 MED ORDER — OXYCODONE HCL 5 MG PO TABS: 5.0000 mg | ORAL_TABLET | Freq: Once | ORAL | Status: DC | PRN

## 2021-06-06 MED ORDER — PROPOFOL 10 MG/ML IV BOLUS
INTRAVENOUS | Status: DC | PRN
  Administered 2021-06-06: 150 mg via INTRAVENOUS

## 2021-06-06 MED ORDER — BUPIVACAINE-EPINEPHRINE 0.25% -1:200000 IJ SOLN
INTRAMUSCULAR | Status: DC | PRN
  Administered 2021-06-06: 20 mL

## 2021-06-06 MED ORDER — PROMETHAZINE HCL 25 MG/ML IJ SOLN: 6.2500 mg | INTRAMUSCULAR | Status: DC | PRN

## 2021-06-06 MED ORDER — DEXAMETHASONE SODIUM PHOSPHATE 4 MG/ML IJ SOLN
INTRAMUSCULAR | Status: DC | PRN
  Administered 2021-06-06: 4 mg via INTRAVENOUS

## 2021-06-06 MED ORDER — LIDOCAINE-EPINEPHRINE 1 %-1:100000 IJ SOLN
INTRAMUSCULAR | Status: AC
  Filled 2021-06-06: qty 1

## 2021-06-06 SURGICAL SUPPLY — 39 items
ADH SKN CLS APL DERMABOND .7 (GAUZE/BANDAGES/DRESSINGS) ×1
APL PRP STRL LF DISP 70% ISPRP (MISCELLANEOUS) ×1
BLADE SURG 15 STRL LF DISP TIS (BLADE) ×1 IMPLANT
BLADE SURG 15 STRL SS (BLADE) ×3
CANISTER SUCT 1200ML W/VALVE (MISCELLANEOUS) ×2 IMPLANT
CHLORAPREP W/TINT 26 (MISCELLANEOUS) ×3 IMPLANT
COVER BACK TABLE 60X90IN (DRAPES) ×3 IMPLANT
COVER MAYO STAND STRL (DRAPES) ×3 IMPLANT
DERMABOND ADVANCED (GAUZE/BANDAGES/DRESSINGS) ×2
DERMABOND ADVANCED .7 DNX12 (GAUZE/BANDAGES/DRESSINGS) IMPLANT
DRAPE U-SHAPE 76X120 STRL (DRAPES) ×2 IMPLANT
DRAPE UTILITY XL STRL (DRAPES) ×3 IMPLANT
DRSG PAD ABDOMINAL 8X10 ST (GAUZE/BANDAGES/DRESSINGS) ×2 IMPLANT
ELECT COATED BLADE 2.86 ST (ELECTRODE) ×2 IMPLANT
ELECT REM PT RETURN 9FT ADLT (ELECTROSURGICAL) ×3
ELECTRODE REM PT RTRN 9FT ADLT (ELECTROSURGICAL) IMPLANT
GLOVE SURG HYDRASOFT LTX SZ5.5 (GLOVE) ×3 IMPLANT
GLOVE SURG UNDER POLY LF SZ6.5 (GLOVE) ×2 IMPLANT
GLOVE SURG UNDER POLY LF SZ7 (GLOVE) ×2 IMPLANT
GOWN STRL REUS W/ TWL LRG LVL3 (GOWN DISPOSABLE) ×2 IMPLANT
GOWN STRL REUS W/TWL LRG LVL3 (GOWN DISPOSABLE) ×6
NDL HYPO 27GX1-1/4 (NEEDLE) ×1 IMPLANT
NEEDLE HYPO 27GX1-1/4 (NEEDLE) ×3 IMPLANT
NS IRRIG 1000ML POUR BTL (IV SOLUTION) ×2 IMPLANT
PACK BASIN DAY SURGERY FS (CUSTOM PROCEDURE TRAY) ×3 IMPLANT
PENCIL SMOKE EVACUATOR (MISCELLANEOUS) ×3 IMPLANT
SHEET MEDIUM DRAPE 40X70 STRL (DRAPES) ×2 IMPLANT
SLEEVE SCD COMPRESS KNEE MED (STOCKING) ×2 IMPLANT
SPONGE T-LAP 18X18 ~~LOC~~+RFID (SPONGE) ×2 IMPLANT
SUT MNCRL AB 4-0 PS2 18 (SUTURE) ×2 IMPLANT
SUT PDS AB 2-0 CT2 27 (SUTURE) ×2 IMPLANT
SUT VIC AB 3-0 SH 27 (SUTURE) ×3
SUT VIC AB 3-0 SH 27X BRD (SUTURE) IMPLANT
SYR BULB EAR ULCER 3OZ GRN STR (SYRINGE) ×2 IMPLANT
SYR CONTROL 10ML LL (SYRINGE) ×3 IMPLANT
TOWEL GREEN STERILE FF (TOWEL DISPOSABLE) ×3 IMPLANT
TUBE CONNECTING 20'X1/4 (TUBING) ×1
TUBE CONNECTING 20X1/4 (TUBING) ×1 IMPLANT
YANKAUER SUCT BULB TIP NO VENT (SUCTIONS) ×2 IMPLANT

## 2021-06-06 NOTE — Op Note (Signed)
Operative Note   DATE OF OPERATION: 12.20.22  LOCATION: [facility]-outpatient  SURGICAL DIVISION: Plastic Surgery  PREOPERATIVE DIAGNOSES:  Soft tissue mass posterior neck  POSTOPERATIVE DIAGNOSES:  same  PROCEDURE:  Excision subfascial mass posterior neck 9 cm  SURGEON: Irene Limbo MD MBA  ASSISTANT: none  ANESTHESIA:  General.   EBL: 10 ml  COMPLICATIONS: None immediate.   INDICATIONS FOR PROCEDURE:  The patient, Julia Combs, is a 50 y.o. female born on 12-02-1970, is here for excision soft tissue mass posterior neck present for several years.   FINDINGS: Clinically lipoma.  DESCRIPTION OF PROCEDURE:  The patient's operative site was marked with the patient in the preoperative area. The patient was taken to the operating room. SCDs were placed and IV antibiotics were given. Following intubation, patient placed in prone position. The patient's operative site was prepped and draped in a sterile fashion. A time out was performed and all information was confirmed to be correct. Incision made over mass and carried through subcutaneous tissue and superficial fascia. Skin flaps elevated off mass. Mass adherent to muscular fascia and excised from this with cautery. Diameter mass 9 cm. Wound irrigated and hemostasis obtained. Local anesthetic infiltrated. Layered closure completed with 2-0 PDS from superficial fascia and plicated to muscle. 3-0 vicryl used to approximate dermis followed by 4-0 monocryl subcuticular skin closure. Dermabond applied followed by dry dressing. Patient returned to supine position.   The patient was allowed to wake from anesthesia, extubated and taken to the recovery room in satisfactory condition.   SPECIMENS: posterior neck mass  DRAINS: none

## 2021-06-06 NOTE — H&P (Signed)
Subjective:   Patient ID: Julia Combs is a 50 y.o. female.  HPI  Presents for excision mass neck. Notes onset mass 2010 with continued growth. Had consultation with Dr. Percell Miller, surgery put off due to start of [place] restrictions.  PMH includes DM Hb A1c 6.5 5/22.  Lives with spouse. Has adult son in [place]. Works as Corporate treasurer for [employer], works with chronically ill pediatric patients.  Review of Systems  Musculoskeletal: Positive for back pain.  Allergic/Immunologic: Positive for environmental allergies.  Psychiatric/Behavioral: The patient is nervous/anxious.   Remainder 12 point review negative  Objective:  Physical Exam Cardiovascular:  Rate and Rhythm: Normal rate. Normal heart sounds Pulmonary: clear to auscultation  Skin: Comments: Fitzpatrick 5    MS: posterior neck with non mobile mass 5 x 9 cm soft non compressible   Assessment:   Soft tissue mass neck   Plan:   Recommend refrain from all nicotine products for at least 4 weeks. Reviewed increased risk complications in setting nicotine use including wound healing problems.  Likely lipoma. Reviewed these are in general benign but small risk liposarcoma reported in this area. Discussed excision in OR, GA, OP surgery. Given her work recommend 10-14 d out of work. Reviewed scar, risks recurrence, damage to adjacent structures, seroma.

## 2021-06-06 NOTE — Anesthesia Postprocedure Evaluation (Signed)
Anesthesia Post Note  Patient: Julia Combs  Procedure(s) Performed: EXCISION SUBFASCIAL NECK MASS 9CM (Neck)     Patient location during evaluation: PACU Anesthesia Type: General Level of consciousness: awake Pain management: pain level controlled Vital Signs Assessment: post-procedure vital signs reviewed and stable Respiratory status: spontaneous breathing and respiratory function stable Cardiovascular status: stable Postop Assessment: no apparent nausea or vomiting Anesthetic complications: no   No notable events documented.  Last Vitals:  Vitals:   06/06/21 0845 06/06/21 0900  BP: 115/75 121/75  Pulse: 90 88  Resp: (!) 22 16  Temp:    SpO2: 99% 95%    Last Pain:  Vitals:   06/06/21 0900  TempSrc:   PainSc: 2                  Merlinda Frederick

## 2021-06-06 NOTE — Transfer of Care (Signed)
Immediate Anesthesia Transfer of Care Note  Patient: Julia Combs  Procedure(s) Performed: EXCISION SUBFASCIAL NECK MASS 9CM (Neck)  Patient Location: PACU  Anesthesia Type:General  Level of Consciousness: sedated  Airway & Oxygen Therapy: Patient Spontanous Breathing and Patient connected to face mask oxygen  Post-op Assessment: Report given to RN and Post -op Vital signs reviewed and stable  Post vital signs: Reviewed and stable  Last Vitals:  Vitals Value Taken Time  BP    Temp    Pulse 100 06/06/21 0832  Resp    SpO2 91 % 06/06/21 0832  Vitals shown include unvalidated device data.  Last Pain:  Vitals:   06/06/21 0641  TempSrc: Oral  PainSc: 0-No pain      Patients Stated Pain Goal: 3 (40/33/53 3174)  Complications: No notable events documented.

## 2021-06-06 NOTE — Discharge Instructions (Signed)
Next dose of Tylenol/Ibuprofen/NSAIDs after 12:50 for pain as needed.   Post Anesthesia Home Care Instructions  Activity: Get plenty of rest for the remainder of the day. A responsible individual must stay with you for 24 hours following the procedure.  For the next 24 hours, DO NOT: -Drive a car -Paediatric nurse -Drink alcoholic beverages -Take any medication unless instructed by your physician -Make any legal decisions or sign important papers.  Meals: Start with liquid foods such as gelatin or soup. Progress to regular foods as tolerated. Avoid greasy, spicy, heavy foods. If nausea and/or vomiting occur, drink only clear liquids until the nausea and/or vomiting subsides. Call your physician if vomiting continues.  Special Instructions/Symptoms: Your throat may feel dry or sore from the anesthesia or the breathing tube placed in your throat during surgery. If this causes discomfort, gargle with warm salt water. The discomfort should disappear within 24 hours.  If you had a scopolamine patch placed behind your ear for the management of post- operative nausea and/or vomiting:  1. The medication in the patch is effective for 72 hours, after which it should be removed.  Wrap patch in a tissue and discard in the trash. Wash hands thoroughly with soap and water. 2. You may remove the patch earlier than 72 hours if you experience unpleasant side effects which may include dry mouth, dizziness or visual disturbances. 3. Avoid touching the patch. Wash your hands with soap and water after contact with the patch.

## 2021-06-06 NOTE — Anesthesia Procedure Notes (Signed)
Procedure Name: Intubation Date/Time: 06/06/2021 7:33 AM Performed by: Maryella Shivers, CRNA Pre-anesthesia Checklist: Patient identified, Emergency Drugs available, Suction available and Patient being monitored Patient Re-evaluated:Patient Re-evaluated prior to induction Oxygen Delivery Method: Circle system utilized Preoxygenation: Pre-oxygenation with 100% oxygen Induction Type: IV induction Ventilation: Mask ventilation without difficulty Laryngoscope Size: Mac and 3 Tube type: Oral Tube size: 7.0 mm Number of attempts: 1 Airway Equipment and Method: Stylet and Oral airway Placement Confirmation: ETT inserted through vocal cords under direct vision, positive ETCO2 and breath sounds checked- equal and bilateral Secured at: 20 cm Tube secured with: Tape Dental Injury: Teeth and Oropharynx as per pre-operative assessment

## 2021-06-07 ENCOUNTER — Encounter (HOSPITAL_BASED_OUTPATIENT_CLINIC_OR_DEPARTMENT_OTHER): Payer: Self-pay | Admitting: Plastic Surgery

## 2021-06-07 LAB — SURGICAL PATHOLOGY

## 2021-08-07 DIAGNOSIS — E119 Type 2 diabetes mellitus without complications: Secondary | ICD-10-CM | POA: Diagnosis not present

## 2021-08-07 DIAGNOSIS — E785 Hyperlipidemia, unspecified: Secondary | ICD-10-CM | POA: Diagnosis not present

## 2021-08-07 DIAGNOSIS — E611 Iron deficiency: Secondary | ICD-10-CM | POA: Diagnosis not present

## 2021-08-07 DIAGNOSIS — Z720 Tobacco use: Secondary | ICD-10-CM | POA: Diagnosis not present

## 2021-08-07 DIAGNOSIS — R69 Illness, unspecified: Secondary | ICD-10-CM | POA: Diagnosis not present

## 2021-08-07 DIAGNOSIS — E6609 Other obesity due to excess calories: Secondary | ICD-10-CM | POA: Diagnosis not present

## 2021-08-07 DIAGNOSIS — M542 Cervicalgia: Secondary | ICD-10-CM | POA: Diagnosis not present

## 2021-08-07 DIAGNOSIS — G43009 Migraine without aura, not intractable, without status migrainosus: Secondary | ICD-10-CM | POA: Diagnosis not present

## 2021-09-04 DIAGNOSIS — E119 Type 2 diabetes mellitus without complications: Secondary | ICD-10-CM | POA: Diagnosis not present

## 2021-09-04 DIAGNOSIS — H2513 Age-related nuclear cataract, bilateral: Secondary | ICD-10-CM | POA: Diagnosis not present

## 2021-09-04 DIAGNOSIS — Z7984 Long term (current) use of oral hypoglycemic drugs: Secondary | ICD-10-CM | POA: Diagnosis not present

## 2021-09-04 DIAGNOSIS — H43393 Other vitreous opacities, bilateral: Secondary | ICD-10-CM | POA: Diagnosis not present

## 2021-09-04 DIAGNOSIS — H5212 Myopia, left eye: Secondary | ICD-10-CM | POA: Diagnosis not present

## 2021-09-04 DIAGNOSIS — H524 Presbyopia: Secondary | ICD-10-CM | POA: Diagnosis not present

## 2021-09-14 DIAGNOSIS — J3089 Other allergic rhinitis: Secondary | ICD-10-CM | POA: Diagnosis not present

## 2021-09-14 DIAGNOSIS — R0981 Nasal congestion: Secondary | ICD-10-CM | POA: Diagnosis not present

## 2021-09-14 DIAGNOSIS — Z20822 Contact with and (suspected) exposure to covid-19: Secondary | ICD-10-CM | POA: Diagnosis not present

## 2021-10-30 DIAGNOSIS — R69 Illness, unspecified: Secondary | ICD-10-CM | POA: Diagnosis not present

## 2021-10-30 DIAGNOSIS — Z7984 Long term (current) use of oral hypoglycemic drugs: Secondary | ICD-10-CM | POA: Diagnosis not present

## 2021-10-30 DIAGNOSIS — E782 Mixed hyperlipidemia: Secondary | ICD-10-CM | POA: Diagnosis not present

## 2021-10-30 DIAGNOSIS — E119 Type 2 diabetes mellitus without complications: Secondary | ICD-10-CM | POA: Diagnosis not present

## 2021-10-30 DIAGNOSIS — Z6831 Body mass index (BMI) 31.0-31.9, adult: Secondary | ICD-10-CM | POA: Diagnosis not present

## 2021-10-30 DIAGNOSIS — G43009 Migraine without aura, not intractable, without status migrainosus: Secondary | ICD-10-CM | POA: Diagnosis not present

## 2021-10-30 DIAGNOSIS — Z0000 Encounter for general adult medical examination without abnormal findings: Secondary | ICD-10-CM | POA: Diagnosis not present

## 2021-10-30 DIAGNOSIS — E611 Iron deficiency: Secondary | ICD-10-CM | POA: Diagnosis not present

## 2021-10-30 DIAGNOSIS — E6609 Other obesity due to excess calories: Secondary | ICD-10-CM | POA: Diagnosis not present

## 2021-11-27 DIAGNOSIS — E611 Iron deficiency: Secondary | ICD-10-CM | POA: Diagnosis not present

## 2021-11-27 DIAGNOSIS — E782 Mixed hyperlipidemia: Secondary | ICD-10-CM | POA: Diagnosis not present

## 2021-11-27 DIAGNOSIS — Z7984 Long term (current) use of oral hypoglycemic drugs: Secondary | ICD-10-CM | POA: Diagnosis not present

## 2021-11-27 DIAGNOSIS — Z6830 Body mass index (BMI) 30.0-30.9, adult: Secondary | ICD-10-CM | POA: Diagnosis not present

## 2021-11-27 DIAGNOSIS — G43009 Migraine without aura, not intractable, without status migrainosus: Secondary | ICD-10-CM | POA: Diagnosis not present

## 2021-11-27 DIAGNOSIS — Z0001 Encounter for general adult medical examination with abnormal findings: Secondary | ICD-10-CM | POA: Diagnosis not present

## 2021-11-27 DIAGNOSIS — R69 Illness, unspecified: Secondary | ICD-10-CM | POA: Diagnosis not present

## 2021-11-27 DIAGNOSIS — E6609 Other obesity due to excess calories: Secondary | ICD-10-CM | POA: Diagnosis not present

## 2021-11-27 DIAGNOSIS — E119 Type 2 diabetes mellitus without complications: Secondary | ICD-10-CM | POA: Diagnosis not present

## 2022-03-02 ENCOUNTER — Encounter (HOSPITAL_BASED_OUTPATIENT_CLINIC_OR_DEPARTMENT_OTHER): Payer: Self-pay | Admitting: Emergency Medicine

## 2022-03-02 ENCOUNTER — Other Ambulatory Visit: Payer: Self-pay

## 2022-03-02 ENCOUNTER — Emergency Department (HOSPITAL_BASED_OUTPATIENT_CLINIC_OR_DEPARTMENT_OTHER)
Admission: EM | Admit: 2022-03-02 | Discharge: 2022-03-02 | Disposition: A | Discharge status: Home / Self Care | Payer: 59 | Attending: Emergency Medicine | Admitting: Emergency Medicine

## 2022-03-02 DIAGNOSIS — E860 Dehydration: Secondary | ICD-10-CM

## 2022-03-02 DIAGNOSIS — E11649 Type 2 diabetes mellitus with hypoglycemia without coma: Secondary | ICD-10-CM | POA: Insufficient documentation

## 2022-03-02 DIAGNOSIS — R112 Nausea with vomiting, unspecified: Secondary | ICD-10-CM

## 2022-03-02 DIAGNOSIS — Z7984 Long term (current) use of oral hypoglycemic drugs: Secondary | ICD-10-CM | POA: Insufficient documentation

## 2022-03-02 LAB — CBC WITH DIFFERENTIAL/PLATELET
Abs Immature Granulocytes: 0.02 10*3/uL (ref 0.00–0.07)
Basophils Absolute: 0 10*3/uL (ref 0.0–0.1)
Basophils Relative: 0 %
Eosinophils Absolute: 0.1 10*3/uL (ref 0.0–0.5)
Eosinophils Relative: 2 %
HCT: 39 % (ref 36.0–46.0)
Hemoglobin: 12.9 g/dL (ref 12.0–15.0)
Immature Granulocytes: 0 %
Lymphocytes Relative: 45 %
Lymphs Abs: 4 10*3/uL (ref 0.7–4.0)
MCH: 27.2 pg (ref 26.0–34.0)
MCHC: 33.1 g/dL (ref 30.0–36.0)
MCV: 82.3 fL (ref 80.0–100.0)
Monocytes Absolute: 0.6 10*3/uL (ref 0.1–1.0)
Monocytes Relative: 6 %
Neutro Abs: 4.1 10*3/uL (ref 1.7–7.7)
Neutrophils Relative %: 47 %
Platelets: 390 10*3/uL (ref 150–400)
RBC: 4.74 MIL/uL (ref 3.87–5.11)
RDW: 14 % (ref 11.5–15.5)
WBC: 8.8 10*3/uL (ref 4.0–10.5)
nRBC: 0 % (ref 0.0–0.2)

## 2022-03-02 LAB — CBG MONITORING, ED: Glucose-Capillary: 105 mg/dL — ABNORMAL HIGH (ref 70–99)

## 2022-03-02 LAB — COMPREHENSIVE METABOLIC PANEL
ALT: 23 U/L (ref 0–44)
AST: 25 U/L (ref 15–41)
Albumin: 4 g/dL (ref 3.5–5.0)
Alkaline Phosphatase: 61 U/L (ref 38–126)
Anion gap: 7 (ref 5–15)
BUN: 9 mg/dL (ref 6–20)
CO2: 28 mmol/L (ref 22–32)
Calcium: 9 mg/dL (ref 8.9–10.3)
Chloride: 105 mmol/L (ref 98–111)
Creatinine, Ser: 0.7 mg/dL (ref 0.44–1.00)
GFR, Estimated: >60 mL/min (ref >60)
Glucose, Bld: 118 mg/dL — ABNORMAL HIGH (ref 70–99)
Potassium: 3.8 mmol/L (ref 3.5–5.1)
Sodium: 140 mmol/L (ref 135–145)
Total Bilirubin: 0.6 mg/dL (ref 0.3–1.2)
Total Protein: 7 g/dL (ref 6.5–8.1)

## 2022-03-02 LAB — LIPASE, BLOOD: Lipase: 40 U/L (ref 11–51)

## 2022-03-02 MED ORDER — PROMETHAZINE HCL 25 MG PO TABS
25.0000 mg | ORAL_TABLET | Freq: Four times a day (QID) | ORAL | 0 refills | Status: AC | PRN
Start: 2022-03-02 — End: ?

## 2022-03-02 MED ORDER — ONDANSETRON HCL 4 MG/2ML IJ SOLN
4.0000 mg | Freq: Once | INTRAMUSCULAR | Status: AC
  Administered 2022-03-02: 4 mg via INTRAVENOUS
  Filled 2022-03-02: qty 2

## 2022-03-02 MED ORDER — LACTATED RINGERS IV BOLUS
1000.0000 mL | Freq: Once | INTRAVENOUS | Status: AC
Start: 2022-03-02 — End: 2022-03-02
  Administered 2022-03-02: 1000 mL via INTRAVENOUS

## 2022-03-02 MED ORDER — SODIUM CHLORIDE 0.9 % IV BOLUS
1000.0000 mL | Freq: Once | INTRAVENOUS | Status: AC
  Administered 2022-03-02: 1000 mL via INTRAVENOUS

## 2022-03-02 NOTE — ED Triage Notes (Addendum)
Pt reports n/v/d x 5 days. Reports blood sugar low last night.

## 2022-03-02 NOTE — ED Provider Notes (Signed)
MSE was initiated and I personally evaluated the patient and placed orders (if any) at  6:34 AM on March 02, 2022.  The patient appears stable so that the remainder of the MSE may be completed by another provider.   She has a history of diabetes and has been feeling sick for 1 week with nausea, vomiting, diarrhea.  2 days ago she had transient hypoglycemia and yesterday she had 2 recurrent episodes of hypoglycemia.  She is on metformin 1000 mg twice daily.  Her last dose was last night.  No fevers, no current abdominal pain.  We will start IV fluids for dehydration.  Plan to check labs.   Julia Reichert, MD 03/02/22 [phone]

## 2022-03-02 NOTE — ED Provider Notes (Signed)
[facility] EMERGENCY DEPARTMENT Provider Note   CSN: [id number] Arrival date & time: 03/02/22  9983     History  Chief Complaint  Patient presents with   Hypoglycemia    Julia Combs is a 51 y.o. female.  The history is provided by the patient and medical records. No language interpreter was used.  Hypoglycemia Initial blood sugar:  50 at home over 100 here Severity:  Moderate Timing:  Unable to specify Progression:  Resolved Chronicity:  New Diabetic status:  Controlled with oral medications Current diabetic therapy:  Metformin Context: decreased oral intake   Context comment:  N/v/d Relieved by:  Nothing Ineffective treatments:  None tried Associated symptoms: vomiting   Associated symptoms: no altered mental status, no decreased responsiveness, no shortness of breath and no sweats        Home Medications Prior to Admission medications   Medication Sig Start Date End Date Taking? Authorizing Provider  albuterol (PROVENTIL HFA;VENTOLIN HFA) 108 (90 Base) MCG/ACT inhaler Inhale 2 puffs into the lungs every 4 (four) hours as needed for wheezing. 10/19/15   [provider]  Ascorbic Acid (VITAMIN C) 1000 MG tablet Take 500 mg by mouth daily.    [provider]  atorvastatin (LIPITOR) 10 MG tablet Take 10 mg by mouth daily.    [provider]  cyclobenzaprine (FLEXERIL) 5 MG tablet Take 5 mg by mouth 3 (three) times daily as needed for muscle spasms.    [provider]  ECHINACEA EXTRACT PO Take 1 tablet by mouth daily.    [provider]  FERROUS SULFATE PO Take by mouth.    [provider]  hydroxypropyl methylcellulose (ISOPTO TEARS) 2.5 % ophthalmic solution Place 1 drop into the left eye 4 (four) times daily as needed for dry eyes. 11/22/13   Varney Biles, MD  Ibuprofen-diphenhydrAMINE HCl 200-25 MG CAPS Take 4 tablets by mouth as needed (pain or sleep).    [provider]  losartan (COZAAR)  25 MG tablet Take 25 mg by mouth daily.    [provider]  metFORMIN (GLUCOPHAGE) 1000 MG tablet Take 1,000 mg by mouth 2 (two) times daily. 07/22/17   [provider]  Multiple Minerals-Vitamins (CALCIUM CITRATE-MAG-MINERALS PO) Take 1 tablet by mouth daily.    [provider]  ondansetron (ZOFRAN ODT) 4 MG disintegrating tablet Take 1 tablet (4 mg total) by mouth every 8 (eight) hours as needed. 01/13/19   Isla Pence, MD  oxyCODONE-acetaminophen (PERCOCET) 5-325 MG tablet Take 1 tablet by mouth every 4 (four) hours as needed for severe pain. 06/06/21 06/06/22  Irene Limbo, MD  SUMAtriptan (IMITREX) 50 MG tablet Take 50 mg by mouth as directed. Take one tablet if having migraine, can take one more in 2 hrs if symptoms not completely improved. 08/02/15   [provider]      Allergies    Trazodone    Review of Systems   Review of Systems  Constitutional:  Positive for fatigue. Negative for chills, decreased responsiveness, diaphoresis and fever.  HENT:  Negative for congestion.   Respiratory:  Negative for cough, chest tightness and shortness of breath.   Cardiovascular:  Negative for chest pain.  Gastrointestinal:  Positive for diarrhea, nausea and vomiting. Negative for abdominal pain.  Genitourinary:  Negative for dysuria and flank pain.  Musculoskeletal:  Negative for back pain.  Skin:  Negative for rash and wound.  Neurological:  Negative for headaches.  Psychiatric/Behavioral:  Negative for agitation.  All other systems reviewed and are negative.   Physical Exam Updated Vital Signs BP 97/80   Pulse 79   Temp 98.1 F (36.7 C) (Oral)   Resp 16   Ht '5\' 1"'$  (1.549 m)   Wt 68 kg   SpO2 99%   BMI 28.34 kg/m  Physical Exam Vitals and nursing note reviewed.  Constitutional:      General: She is not in acute distress.    Appearance: She is well-developed. She is not ill-appearing, toxic-appearing or diaphoretic.  HENT:     Head:  Normocephalic and atraumatic.     Mouth/Throat:     Mouth: Mucous membranes are dry.  Eyes:     Extraocular Movements: Extraocular movements intact.     Conjunctiva/sclera: Conjunctivae normal.     Pupils: Pupils are equal, round, and reactive to light.  Cardiovascular:     Rate and Rhythm: Normal rate and regular rhythm.     Heart sounds: No murmur heard. Pulmonary:     Effort: Pulmonary effort is normal. No respiratory distress.     Breath sounds: Normal breath sounds. No wheezing, rhonchi or rales.  Chest:     Chest wall: No tenderness.  Abdominal:     General: Abdomen is flat.     Palpations: Abdomen is soft.     Tenderness: There is no abdominal tenderness. There is no guarding or rebound.  Musculoskeletal:        General: No swelling or tenderness.     Cervical back: Neck supple.  Skin:    General: Skin is warm and dry.     Capillary Refill: Capillary refill takes less than 2 seconds.     Findings: No erythema.  Neurological:     General: No focal deficit present.     Mental Status: She is alert.  Psychiatric:        Mood and Affect: Mood normal.     ED Results / Procedures / Treatments   Labs (all labs ordered are listed, but only abnormal results are displayed) Labs Reviewed  COMPREHENSIVE METABOLIC PANEL - Abnormal; Notable for the following components:      Result Value   Glucose, Bld 118 (*)    All other components within normal limits  CBG MONITORING, ED - Abnormal; Notable for the following components:   Glucose-Capillary 105 (*)    All other components within normal limits  CBC WITH DIFFERENTIAL/PLATELET  LIPASE, BLOOD  URINALYSIS, ROUTINE W REFLEX MICROSCOPIC    EKG None  Radiology No results found.  Procedures Procedures    Medications Ordered in ED Medications  ondansetron (ZOFRAN) injection 4 mg (4 mg Intravenous Given 03/02/22 0624)  lactated ringers bolus 1,000 mL (0 mLs Intravenous Stopped 03/02/22 0733)  sodium chloride 0.9 % bolus  1,000 mL (0 mLs Intravenous Stopped 03/02/22 0846)    ED Course/ Medical Decision Making/ A&P                           Medical Decision Making Amount and/or Complexity of Data Reviewed Labs: ordered.  Risk Prescription drug management.    Julia Combs is a 51 y.o. female with a past medical history significant for hypercholesterolemia, diabetes, and previous hysterectomy who presents with 5 days of nausea, vomiting, diarrhea, and hypoglycemia overnight.  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  She suspect she got it  from them.  She reports has had approximately 5 days of nausea, vomiting, diarrhea that has been waxing and waning but recurred last 2 days.  She reports that she has not been keeping as much down and think she is getting dehydrated.  She continues to take her metformin for her diabetes but last night found her blood sugar to be 50.  She reports she is able to tolerate some p.o. but was concerned about the hypoglycemia.  She denies any other fevers or chills currently and denies any chest pain, abdominal pain, back pain, or flank pain.  Denies any blood in her emesis or stool and denies any trauma.  On exam, lungs clear and chest nontender.  Abdomen nontender.  Normal bowel sounds.  Patient has dry mucous membranes but otherwise is well-appearing and answering questions appropriately.  Vital signs reassuring on my evaluation.  Patient had screening in triage with previous provider and had some screening labs.  Patient's labs were overall reassuring and glucose was improved over 100.  Clinically I suspect patient is dehydrated.  She was given her first liter in triage and is starting to feel better.  We will give another liter of fluids and then do a gentle p.o. challenge.  I suspect she got dehydrated due to the likely viral gastroenteritis.  Given her lack of respiratory symptoms we agreed to  hold on COVID test after discussion with patient.   Given reassuring labs, if she is able to pass a p.o. challenge after further rehydration, anticipate discharge with nausea medication and work notes.         Final Clinical Impression(s) / ED Diagnoses Final diagnoses:  Nausea vomiting and diarrhea  Dehydration    Rx / DC Orders ED Discharge Orders          Ordered    promethazine (PHENERGAN) 25 MG tablet  Every 6 hours PRN        03/02/22 0857           Clinical Impression: 1. Nausea vomiting and diarrhea   2. Dehydration     Disposition: Discharge  Condition: Good  I have discussed the results, Dx and Tx plan with the pt(& family if present). He/she/they expressed understanding and agree(s) with the plan. Discharge instructions discussed at great length. Strict return precautions discussed and pt &/or family have verbalized understanding of the instructions. No further questions at time of discharge.    New Prescriptions   PROMETHAZINE (PHENERGAN) 25 MG TABLET    Take 1 tablet (25 mg total) by mouth every 6 (six) hours as needed for nausea or vomiting.    Follow Up: Karleen Hampshire., MD [address] [place] [id number] [phone]     [facility] [address] [id number] [place] [phone]       Kiasia Chou, Gwenyth Allegra, MD 03/02/22 [phone]

## 2022-03-02 NOTE — Discharge Instructions (Signed)
Your history, exam and work-up today are consistent with dehydration related to the likely viral gastroenteritis picked up from your exposures with the nausea, vomiting, diarrhea, and fatigue.  I suspect your transient hypoglycemia was related to dehydration and after fluids and labs you appear and feel much better.  Please rest and stay hydrated and use the nausea medicine to help with needed.  Please follow-up with your primary doctor.  If any symptoms change or worsen acutely, please return to the nearest emergency department.

## 2022-07-03 DIAGNOSIS — Z20822 Contact with and (suspected) exposure to covid-19: Secondary | ICD-10-CM | POA: Diagnosis not present

## 2022-07-03 DIAGNOSIS — J069 Acute upper respiratory infection, unspecified: Secondary | ICD-10-CM | POA: Diagnosis not present

## 2022-07-03 DIAGNOSIS — R69 Illness, unspecified: Secondary | ICD-10-CM | POA: Diagnosis not present

## 2022-07-09 DIAGNOSIS — Z1231 Encounter for screening mammogram for malignant neoplasm of breast: Secondary | ICD-10-CM | POA: Diagnosis not present

## 2022-09-17 DIAGNOSIS — B354 Tinea corporis: Secondary | ICD-10-CM | POA: Diagnosis not present

## 2022-09-25 DIAGNOSIS — J069 Acute upper respiratory infection, unspecified: Secondary | ICD-10-CM | POA: Diagnosis not present

## 2022-09-25 DIAGNOSIS — Z20822 Contact with and (suspected) exposure to covid-19: Secondary | ICD-10-CM | POA: Diagnosis not present

## 2022-10-01 DIAGNOSIS — E119 Type 2 diabetes mellitus without complications: Secondary | ICD-10-CM | POA: Diagnosis not present

## 2022-10-01 DIAGNOSIS — H2513 Age-related nuclear cataract, bilateral: Secondary | ICD-10-CM | POA: Diagnosis not present

## 2022-10-01 DIAGNOSIS — H43393 Other vitreous opacities, bilateral: Secondary | ICD-10-CM | POA: Diagnosis not present

## 2022-10-01 DIAGNOSIS — H11133 Conjunctival pigmentations, bilateral: Secondary | ICD-10-CM | POA: Diagnosis not present

## 2022-10-01 DIAGNOSIS — H524 Presbyopia: Secondary | ICD-10-CM | POA: Diagnosis not present

## 2022-12-03 DIAGNOSIS — E119 Type 2 diabetes mellitus without complications: Secondary | ICD-10-CM | POA: Diagnosis not present

## 2022-12-03 DIAGNOSIS — E782 Mixed hyperlipidemia: Secondary | ICD-10-CM | POA: Diagnosis not present

## 2023-01-21 DIAGNOSIS — F5101 Primary insomnia: Secondary | ICD-10-CM | POA: Diagnosis not present

## 2023-01-21 DIAGNOSIS — Z91199 Patient's noncompliance with other medical treatment and regimen due to unspecified reason: Secondary | ICD-10-CM | POA: Diagnosis not present

## 2023-01-21 DIAGNOSIS — E782 Mixed hyperlipidemia: Secondary | ICD-10-CM | POA: Diagnosis not present

## 2023-01-21 DIAGNOSIS — Z0000 Encounter for general adult medical examination without abnormal findings: Secondary | ICD-10-CM | POA: Diagnosis not present

## 2023-01-21 DIAGNOSIS — Z1231 Encounter for screening mammogram for malignant neoplasm of breast: Secondary | ICD-10-CM | POA: Diagnosis not present

## 2023-01-21 DIAGNOSIS — F17210 Nicotine dependence, cigarettes, uncomplicated: Secondary | ICD-10-CM | POA: Diagnosis not present

## 2023-01-21 DIAGNOSIS — E119 Type 2 diabetes mellitus without complications: Secondary | ICD-10-CM | POA: Diagnosis not present

## 2023-01-21 DIAGNOSIS — E6609 Other obesity due to excess calories: Secondary | ICD-10-CM | POA: Diagnosis not present

## 2023-01-21 DIAGNOSIS — G43009 Migraine without aura, not intractable, without status migrainosus: Secondary | ICD-10-CM | POA: Diagnosis not present

## 2023-03-19 ENCOUNTER — Other Ambulatory Visit: Payer: Self-pay

## 2023-03-19 ENCOUNTER — Emergency Department (HOSPITAL_BASED_OUTPATIENT_CLINIC_OR_DEPARTMENT_OTHER): Payer: 59

## 2023-03-19 ENCOUNTER — Encounter (HOSPITAL_BASED_OUTPATIENT_CLINIC_OR_DEPARTMENT_OTHER): Payer: Self-pay

## 2023-03-19 DIAGNOSIS — Z79899 Other long term (current) drug therapy: Secondary | ICD-10-CM | POA: Diagnosis not present

## 2023-03-19 DIAGNOSIS — I10 Essential (primary) hypertension: Secondary | ICD-10-CM | POA: Diagnosis not present

## 2023-03-19 DIAGNOSIS — R079 Chest pain, unspecified: Secondary | ICD-10-CM | POA: Diagnosis not present

## 2023-03-19 DIAGNOSIS — R072 Precordial pain: Secondary | ICD-10-CM | POA: Diagnosis not present

## 2023-03-19 DIAGNOSIS — E119 Type 2 diabetes mellitus without complications: Secondary | ICD-10-CM | POA: Diagnosis not present

## 2023-03-19 LAB — BASIC METABOLIC PANEL
Anion gap: 12 (ref 5–15)
BUN: 6 mg/dL (ref 6–20)
CO2: 25 mmol/L (ref 22–32)
Calcium: 9 mg/dL (ref 8.9–10.3)
Chloride: 102 mmol/L (ref 98–111)
Creatinine, Ser: 0.71 mg/dL (ref 0.44–1.00)
GFR, Estimated: >60 mL/min (ref >60)
Glucose, Bld: 78 mg/dL (ref 70–99)
Potassium: 3.3 mmol/L — ABNORMAL LOW (ref 3.5–5.1)
Sodium: 139 mmol/L (ref 135–145)

## 2023-03-19 LAB — CBC
HCT: 40.2 % (ref 36.0–46.0)
Hemoglobin: 13.3 g/dL (ref 12.0–15.0)
MCH: 27.5 pg (ref 26.0–34.0)
MCHC: 33.1 g/dL (ref 30.0–36.0)
MCV: 83.1 fL (ref 80.0–100.0)
Platelets: 358 10*3/uL (ref 150–400)
RBC: 4.84 MIL/uL (ref 3.87–5.11)
RDW: 14.6 % (ref 11.5–15.5)
WBC: 7.9 10*3/uL (ref 4.0–10.5)
nRBC: 0 % (ref 0.0–0.2)

## 2023-03-19 LAB — CBG MONITORING, ED: Glucose-Capillary: 76 mg/dL (ref 70–99)

## 2023-03-19 NOTE — ED Triage Notes (Signed)
Pt reports chest pain, SHOB, nausea, dizziness and headache. Started this morning. Pt states she is diabetic and her blood sugar has been running lower than usual. Denies falls or LOC

## 2023-03-20 ENCOUNTER — Emergency Department (HOSPITAL_BASED_OUTPATIENT_CLINIC_OR_DEPARTMENT_OTHER)
Admission: EM | Admit: 2023-03-20 | Discharge: 2023-03-20 | Disposition: A | Discharge status: Home / Self Care | Payer: 59 | Attending: Emergency Medicine | Admitting: Emergency Medicine

## 2023-03-20 DIAGNOSIS — R072 Precordial pain: Secondary | ICD-10-CM

## 2023-03-20 LAB — TROPONIN I (HIGH SENSITIVITY): Troponin I (High Sensitivity): 4 ng/L (ref <18)

## 2023-03-20 MED ORDER — SUCRALFATE 1 G PO TABS: 1.0000 g | ORAL_TABLET | Freq: Three times a day (TID) | ORAL | 0 refills | Status: AC

## 2023-03-20 MED ORDER — PROMETHAZINE HCL 25 MG PO TABS: 25.0000 mg | ORAL_TABLET | Freq: Four times a day (QID) | ORAL | 0 refills | Status: AC | PRN

## 2023-03-20 NOTE — ED Provider Notes (Signed)
[place] [facility] AT [facility] Provider Note  CSN: [id number] Arrival date & time: 03/19/23 2257  Chief Complaint(s) Chest Pain  HPI Julia Combs is a 51 y.o. female with past medical history as below, significant for depression, DM, HLD who presents to the ED with complaint of chest pain, nausea, headache, body aches, low blood sugar  Ongoing symptoms for the last 2 days.  They have improved since the onset.  She has recurrent nausea that began with initiation of her Ozempic, she takes Phenergan at home intermittently.  Left-sided chest wall pain over the past 2 days, intermittent, unsure provoking or relieving factors.  Nausea without vomiting.  No diarrhea.  No fevers or chills.  + body aches, headache; headache similar to prior headaches.  No head trauma.  No change to bowel or bladder function.  No rashes.  No fevers.  No recent travel or sick contacts, no suspicious p.o. intake.  She is compliant with home medications. Past Medical History Past Medical History:  Diagnosis Date   Depression    no meds   Diabetes mellitus without complication (HCC)    Headache(784.0)    otc prn   Heart murmur    High cholesterol    There are no problems to display for this patient.  Home Medication(s) Prior to Admission medications   Medication Sig Start Date End Date Taking? Authorizing Provider  promethazine (PHENERGAN) 25 MG tablet Take 1 tablet (25 mg total) by mouth every 6 (six) hours as needed for nausea or vomiting. 03/20/23  Yes Tanda Rockers A, DO  sucralfate (CARAFATE) 1 g tablet Take 1 tablet (1 g total) by mouth with breakfast, with lunch, and with evening meal for 7 days. 03/20/23 03/27/23 Yes Tanda Rockers A, DO  albuterol (PROVENTIL HFA;VENTOLIN HFA) 108 (90 Base) MCG/ACT inhaler Inhale 2 puffs into the lungs every 4 (four) hours as needed for wheezing. 10/19/15   [provider]  Ascorbic Acid (VITAMIN C) 1000 MG tablet Take 500 mg by mouth daily.     [provider]  atorvastatin (LIPITOR) 10 MG tablet Take 10 mg by mouth daily.    [provider]  cyclobenzaprine (FLEXERIL) 5 MG tablet Take 5 mg by mouth 3 (three) times daily as needed for muscle spasms.    [provider]  ECHINACEA EXTRACT PO Take 1 tablet by mouth daily.    [provider]  FERROUS SULFATE PO Take by mouth.    [provider]  hydroxypropyl methylcellulose (ISOPTO TEARS) 2.5 % ophthalmic solution Place 1 drop into the left eye 4 (four) times daily as needed for dry eyes. 11/22/13   Derwood Kaplan, MD  Ibuprofen-diphenhydrAMINE HCl 200-25 MG CAPS Take 4 tablets by mouth as needed (pain or sleep).    [provider]  losartan (COZAAR) 25 MG tablet Take 25 mg by mouth daily.    [provider]  metFORMIN (GLUCOPHAGE) 1000 MG tablet Take 1,000 mg by mouth 2 (two) times daily. 07/22/17   [provider]  Multiple Minerals-Vitamins (CALCIUM CITRATE-MAG-MINERALS PO) Take 1 tablet by mouth daily.    [provider]  ondansetron (ZOFRAN ODT) 4 MG disintegrating tablet Take 1 tablet (4 mg total) by mouth every 8 (eight) hours as needed. 01/13/19   Jacalyn Lefevre, MD  promethazine (PHENERGAN) 25 MG tablet Take 1 tablet (25 mg total) by mouth every 6 (six) hours as needed for nausea or vomiting. 03/02/22   Tegeler, Canary Brim, MD  SUMAtriptan Willette Brace)  50 MG tablet Take 50 mg by mouth as directed. Take one tablet if having migraine, can take one more in 2 hrs if symptoms not completely improved. 08/02/15   [provider]                                                                                                                                    Past Surgical History Past Surgical History:  Procedure Laterality Date   ABDOMINAL HYSTERECTOMY  03/19/2011   Procedure: HYSTERECTOMY ABDOMINAL;  Surgeon: Fortino Sic, MD;  Location: [facility];  Service: Gynecology;  Laterality: N/A;   CESAREAN  SECTION     EXCISION MASS NECK N/A 06/06/2021   Procedure: EXCISION SUBFASCIAL NECK MASS 9CM;  Surgeon: Glenna Fellows, MD;  Location: [facility];  Service: Plastics;  Laterality: N/A;   mab      x 1  no surgery required   tab      x 1   WISDOM TOOTH EXTRACTION     Family History History reviewed. No pertinent family history.  Social History Social History   Tobacco Use   Smoking status: Every Day    Current packs/day: 1.00    Average packs/day: 1 pack/day for 18.0 years (18.0 ttl pk-yrs)    Types: Cigarettes   Smokeless tobacco: Never  Vaping Use   Vaping status: Never Used  Substance Use Topics   Alcohol use: Yes    Comment: occasional   Drug use: No   Allergies Trazodone  Review of Systems Review of Systems  Constitutional:  Negative for chills and fever.  Respiratory:  Negative for chest tightness and shortness of breath.   Cardiovascular:  Positive for chest pain. Negative for palpitations.  Gastrointestinal:  Positive for nausea. Negative for abdominal pain, diarrhea and vomiting.  Genitourinary:  Negative for dysuria and urgency.  Musculoskeletal:  Positive for arthralgias.  Neurological:  Positive for headaches.  All other systems reviewed and are negative.   Physical Exam Vital Signs  I have reviewed the triage vital signs BP (!) 147/83 (BP Location: Right Arm)   Pulse 82   Temp 98.2 F (36.8 C) (Oral)   Resp 18   Ht 5\' 1"  (1.549 m)   Wt 61.2 kg   SpO2 100%   BMI 25.51 kg/m  Physical Exam Vitals and nursing note reviewed.  Constitutional:      General: She is not in acute distress.    Appearance: Normal appearance.  HENT:     Head: Normocephalic and atraumatic.     Right Ear: External ear normal.     Left Ear: External ear normal.     Nose: Nose normal.     Mouth/Throat:     Mouth: Mucous membranes are moist.  Eyes:     General: No scleral icterus.       Right eye: No discharge.        Left eye:  No discharge.   Cardiovascular:     Rate and Rhythm: Normal rate and regular rhythm.     Pulses: Normal pulses.     Heart sounds: Normal heart sounds.     No S3 or S4 sounds.  Pulmonary:     Effort: Pulmonary effort is normal. No respiratory distress.     Breath sounds: Normal breath sounds. No stridor.  Abdominal:     General: Abdomen is flat. There is no distension.     Palpations: Abdomen is soft.     Tenderness: There is no abdominal tenderness. There is no guarding or rebound.  Musculoskeletal:     Cervical back: No rigidity.     Right lower leg: No edema.     Left lower leg: No edema.  Skin:    General: Skin is warm and dry.     Capillary Refill: Capillary refill takes less than 2 seconds.  Neurological:     Mental Status: She is alert and oriented to person, place, and time.     GCS: GCS eye subscore is 4. GCS verbal subscore is 5. GCS motor subscore is 6.     Cranial Nerves: No dysarthria or facial asymmetry.     Motor: Motor function is intact. No tremor.     Gait: Gait is intact.  Psychiatric:        Mood and Affect: Mood normal.        Behavior: Behavior normal. Behavior is cooperative.     ED Results and Treatments Labs (all labs ordered are listed, but only abnormal results are displayed) Labs Reviewed  BASIC METABOLIC PANEL - Abnormal; Notable for the following components:      Result Value   Potassium 3.3 (*)    All other components within normal limits  CBC  CBG MONITORING, ED  TROPONIN I (HIGH SENSITIVITY)                                                                                                                          Radiology DG Chest 2 View  Result Date: 03/19/2023 CLINICAL DATA:  Chest pain EXAM: CHEST - 2 VIEW COMPARISON:  11/05/2019 FINDINGS: The heart size and mediastinal contours are within normal limits. Both lungs are clear. The visualized skeletal structures are unremarkable. IMPRESSION: No active cardiopulmonary disease. Electronically Signed   By:  Alcide Clever M.D.   On: 03/19/2023 23:59    Pertinent labs & imaging results that were available during my care of the patient were reviewed by me and considered in my medical decision making (see MDM for details).  Medications Ordered in ED Medications - No data to display  Procedures Procedures  (including critical care time)  Medical Decision Making / ED Course    Medical Decision Making:    ROMANDA TURRUBIATES is a 52 y.o. female with past medical history as below, significant for depression, DM, HLD who presents to the ED with complaint of chest pain, nausea, headache, body aches. The complaint involves an extensive differential diagnosis and also carries with it a high risk of complications and morbidity.  Serious etiology was considered. Ddx includes but is not limited to: Differential includes all life-threatening causes for chest pain. This includes but is not exclusive to acute coronary syndrome, aortic dissection, pulmonary embolism, cardiac tamponade, community-acquired pneumonia, pericarditis, musculoskeletal chest wall pain, etc.   Complete initial physical exam performed, notably the patient  was no acute distress, hypoxia.  Exam stable.    Reviewed and confirmed nursing documentation for past medical history, family history, social history.  Vital signs reviewed.      She is feeling better on recheck, tolerating p.o.  Symptoms improved without intervention.  Eager for discharge   The patient's chest pain is not suggestive of pulmonary embolus, cardiac ischemia, aortic dissection, pericarditis, myocarditis, pulmonary embolism, pneumothorax, pneumonia, Zoster, or esophageal perforation, or other serious etiology.  Historically not abrupt in onset, tearing or ripping, pulses symmetric. EKG nonspecific for ischemia/infarction. No dysrhythmias, brugada,  WPW, prolonged QT noted.   Troponin negative x1; cp ongoing for 2 days, delta Trop not needed.. CXR reviewed. Labs without demonstration of acute pathology unless otherwise noted above. LOW HEART score  Given the extremely low risk of these diagnoses further testing and evaluation for these possibilities does not appear to be indicated at this time. Patient in no distress and overall condition improved here in the ED. Detailed discussions were had with the patient regarding current findings, and need for close f/u with PCP or on call doctor. The patient has been instructed to return immediately if the symptoms worsen in any way for re-evaluation. Patient verbalized understanding and is in agreement with current care plan. All questions answered prior to discharge.                   Additional history obtained: -Additional history obtained from na -External records from outside source obtained and reviewed including: Chart review including previous notes, labs, imaging, consultation notes including  Primary care documentation, home meds, prior labs    Lab Tests: -I ordered, reviewed, and interpreted labs.   The pertinent results include:   Labs Reviewed  BASIC METABOLIC PANEL - Abnormal; Notable for the following components:      Result Value   Potassium 3.3 (*)    All other components within normal limits  CBC  CBG MONITORING, ED  TROPONIN I (HIGH SENSITIVITY)    Notable for potassium slightly low, okay  EKG   EKG Interpretation Date/Time:  Tuesday March 19 2023 23:16:00 EDT Ventricular Rate:  91 PR Interval:  194 QRS Duration:  99 QT Interval:  393 QTC Calculation: 484 R Axis:   79  Text Interpretation: Sinus rhythm Borderline T wave abnormalities no stemi Confirmed by Tanda Rockers ([id number]) on 03/20/2023 4:36:46 AM         Imaging Studies ordered: I ordered imaging studies including chest x-ray I independently visualized the following imaging with scope of  interpretation limited to determining acute life threatening conditions related to emergency care; findings noted above, significant for no acute changes I independently visualized and interpreted imaging. I agree with the radiologist interpretation  Medicines ordered and prescription drug management: Meds ordered this encounter  Medications   sucralfate (CARAFATE) 1 g tablet    Sig: Take 1 tablet (1 g total) by mouth with breakfast, with lunch, and with evening meal for 7 days.    Dispense:  21 tablet    Refill:  0   promethazine (PHENERGAN) 25 MG tablet    Sig: Take 1 tablet (25 mg total) by mouth every 6 (six) hours as needed for nausea or vomiting.    Dispense:  8 tablet    Refill:  0    -I have reviewed the patients home medicines and have made adjustments as needed   Consultations Obtained: na   Cardiac Monitoring: The patient was maintained on a cardiac monitor.  I personally viewed and interpreted the cardiac monitored which showed an underlying rhythm of: NSR  Social Determinants of Health:  Diagnosis or treatment significantly limited by social determinants of health: current smoker   Reevaluation: After the interventions noted above, I reevaluated the patient and found that they have improved  Co morbidities that complicate the patient evaluation  Past Medical History:  Diagnosis Date   Depression    no meds   Diabetes mellitus without complication (HCC)    Headache(784.0)    otc prn   Heart murmur    High cholesterol       Dispostion: Disposition decision including need for hospitalization was considered, and patient discharged from emergency department.    Final Clinical Impression(s) / ED Diagnoses Final diagnoses:  Precordial pain        Sloan Leiter, DO 03/20/23 0440

## 2023-03-20 NOTE — Discharge Instructions (Signed)
It was a pleasure caring for you today in the emergency department. ° °Please return to the emergency department for any worsening or worrisome symptoms. ° ° °

## 2023-04-01 DIAGNOSIS — Z720 Tobacco use: Secondary | ICD-10-CM | POA: Diagnosis not present

## 2023-04-01 DIAGNOSIS — Z91199 Patient's noncompliance with other medical treatment and regimen due to unspecified reason: Secondary | ICD-10-CM | POA: Diagnosis not present

## 2023-04-01 DIAGNOSIS — L309 Dermatitis, unspecified: Secondary | ICD-10-CM | POA: Diagnosis not present

## 2023-04-01 DIAGNOSIS — Z23 Encounter for immunization: Secondary | ICD-10-CM | POA: Diagnosis not present

## 2023-04-01 DIAGNOSIS — E119 Type 2 diabetes mellitus without complications: Secondary | ICD-10-CM | POA: Diagnosis not present

## 2023-04-01 DIAGNOSIS — F5101 Primary insomnia: Secondary | ICD-10-CM | POA: Diagnosis not present

## 2023-04-01 DIAGNOSIS — R030 Elevated blood-pressure reading, without diagnosis of hypertension: Secondary | ICD-10-CM | POA: Diagnosis not present

## 2023-04-01 DIAGNOSIS — E782 Mixed hyperlipidemia: Secondary | ICD-10-CM | POA: Diagnosis not present

## 2023-04-08 DIAGNOSIS — R030 Elevated blood-pressure reading, without diagnosis of hypertension: Secondary | ICD-10-CM | POA: Diagnosis not present

## 2023-04-08 DIAGNOSIS — Z0130 Encounter for examination of blood pressure without abnormal findings: Secondary | ICD-10-CM | POA: Diagnosis not present

## 2023-04-22 DIAGNOSIS — R030 Elevated blood-pressure reading, without diagnosis of hypertension: Secondary | ICD-10-CM | POA: Diagnosis not present

## 2023-04-22 DIAGNOSIS — Z0130 Encounter for examination of blood pressure without abnormal findings: Secondary | ICD-10-CM | POA: Diagnosis not present

## 2023-05-06 DIAGNOSIS — R030 Elevated blood-pressure reading, without diagnosis of hypertension: Secondary | ICD-10-CM | POA: Diagnosis not present

## 2023-07-25 DIAGNOSIS — R112 Nausea with vomiting, unspecified: Secondary | ICD-10-CM | POA: Diagnosis not present

## 2023-07-25 DIAGNOSIS — R197 Diarrhea, unspecified: Secondary | ICD-10-CM | POA: Diagnosis not present

## 2023-08-05 DIAGNOSIS — F5101 Primary insomnia: Secondary | ICD-10-CM | POA: Diagnosis not present

## 2023-08-05 DIAGNOSIS — E119 Type 2 diabetes mellitus without complications: Secondary | ICD-10-CM | POA: Diagnosis not present

## 2023-08-05 DIAGNOSIS — Z720 Tobacco use: Secondary | ICD-10-CM | POA: Diagnosis not present

## 2023-08-05 DIAGNOSIS — E6609 Other obesity due to excess calories: Secondary | ICD-10-CM | POA: Diagnosis not present

## 2023-08-05 DIAGNOSIS — D649 Anemia, unspecified: Secondary | ICD-10-CM | POA: Diagnosis not present

## 2023-08-05 DIAGNOSIS — E782 Mixed hyperlipidemia: Secondary | ICD-10-CM | POA: Diagnosis not present

## 2023-08-05 DIAGNOSIS — Z91199 Patient's noncompliance with other medical treatment and regimen due to unspecified reason: Secondary | ICD-10-CM | POA: Diagnosis not present

## 2023-09-09 DIAGNOSIS — Z1231 Encounter for screening mammogram for malignant neoplasm of breast: Secondary | ICD-10-CM | POA: Diagnosis not present

## 2023-10-21 DIAGNOSIS — H2513 Age-related nuclear cataract, bilateral: Secondary | ICD-10-CM | POA: Diagnosis not present

## 2023-10-21 DIAGNOSIS — H11133 Conjunctival pigmentations, bilateral: Secondary | ICD-10-CM | POA: Diagnosis not present

## 2023-10-21 DIAGNOSIS — E119 Type 2 diabetes mellitus without complications: Secondary | ICD-10-CM | POA: Diagnosis not present

## 2023-10-21 DIAGNOSIS — H43393 Other vitreous opacities, bilateral: Secondary | ICD-10-CM | POA: Diagnosis not present

## 2023-10-21 DIAGNOSIS — H524 Presbyopia: Secondary | ICD-10-CM | POA: Diagnosis not present

## 2023-10-31 DIAGNOSIS — R21 Rash and other nonspecific skin eruption: Secondary | ICD-10-CM | POA: Diagnosis not present

## 2023-10-31 DIAGNOSIS — F419 Anxiety disorder, unspecified: Secondary | ICD-10-CM | POA: Diagnosis not present

## 2023-11-11 DIAGNOSIS — F410 Panic disorder [episodic paroxysmal anxiety] without agoraphobia: Secondary | ICD-10-CM | POA: Diagnosis not present

## 2023-11-11 DIAGNOSIS — F32A Depression, unspecified: Secondary | ICD-10-CM | POA: Diagnosis not present

## 2023-11-11 DIAGNOSIS — F4312 Post-traumatic stress disorder, chronic: Secondary | ICD-10-CM | POA: Diagnosis not present

## 2023-11-25 DIAGNOSIS — F4312 Post-traumatic stress disorder, chronic: Secondary | ICD-10-CM | POA: Diagnosis not present

## 2023-11-25 DIAGNOSIS — F410 Panic disorder [episodic paroxysmal anxiety] without agoraphobia: Secondary | ICD-10-CM | POA: Diagnosis not present

## 2023-11-25 DIAGNOSIS — F32A Depression, unspecified: Secondary | ICD-10-CM | POA: Diagnosis not present

## 2023-12-02 DIAGNOSIS — F4312 Post-traumatic stress disorder, chronic: Secondary | ICD-10-CM | POA: Diagnosis not present

## 2023-12-02 DIAGNOSIS — E119 Type 2 diabetes mellitus without complications: Secondary | ICD-10-CM | POA: Diagnosis not present

## 2023-12-02 DIAGNOSIS — Z7984 Long term (current) use of oral hypoglycemic drugs: Secondary | ICD-10-CM | POA: Diagnosis not present

## 2023-12-02 DIAGNOSIS — E782 Mixed hyperlipidemia: Secondary | ICD-10-CM | POA: Diagnosis not present

## 2023-12-02 DIAGNOSIS — Z7985 Long-term (current) use of injectable non-insulin antidiabetic drugs: Secondary | ICD-10-CM | POA: Diagnosis not present

## 2023-12-02 DIAGNOSIS — E6609 Other obesity due to excess calories: Secondary | ICD-10-CM | POA: Diagnosis not present

## 2023-12-02 DIAGNOSIS — F32A Depression, unspecified: Secondary | ICD-10-CM | POA: Diagnosis not present

## 2023-12-02 DIAGNOSIS — F410 Panic disorder [episodic paroxysmal anxiety] without agoraphobia: Secondary | ICD-10-CM | POA: Diagnosis not present

## 2023-12-09 DIAGNOSIS — F4312 Post-traumatic stress disorder, chronic: Secondary | ICD-10-CM | POA: Diagnosis not present

## 2023-12-09 DIAGNOSIS — F32A Depression, unspecified: Secondary | ICD-10-CM | POA: Diagnosis not present

## 2023-12-09 DIAGNOSIS — F410 Panic disorder [episodic paroxysmal anxiety] without agoraphobia: Secondary | ICD-10-CM | POA: Diagnosis not present

## 2023-12-30 DIAGNOSIS — F410 Panic disorder [episodic paroxysmal anxiety] without agoraphobia: Secondary | ICD-10-CM | POA: Diagnosis not present

## 2023-12-30 DIAGNOSIS — F32A Depression, unspecified: Secondary | ICD-10-CM | POA: Diagnosis not present

## 2024-01-13 DIAGNOSIS — H66001 Acute suppurative otitis media without spontaneous rupture of ear drum, right ear: Secondary | ICD-10-CM | POA: Diagnosis not present

## 2024-01-13 DIAGNOSIS — J029 Acute pharyngitis, unspecified: Secondary | ICD-10-CM | POA: Diagnosis not present

## 2024-01-14 ENCOUNTER — Encounter: Payer: Self-pay | Admitting: Internal Medicine

## 2024-01-14 NOTE — Progress Notes (Signed)
 Patient was identified as falling into the [place] Measure - Diabetes.   Patient was: Attribution and/or data issue.  Validation/Investigation needed.  Explanation:  Patient is not seen at [facility]. PCP is Camie Cools, MD

## 2024-01-15 DIAGNOSIS — D649 Anemia, unspecified: Secondary | ICD-10-CM | POA: Diagnosis not present

## 2024-01-15 DIAGNOSIS — F5101 Primary insomnia: Secondary | ICD-10-CM | POA: Diagnosis not present

## 2024-01-15 DIAGNOSIS — Z720 Tobacco use: Secondary | ICD-10-CM | POA: Diagnosis not present

## 2024-01-15 DIAGNOSIS — G43009 Migraine without aura, not intractable, without status migrainosus: Secondary | ICD-10-CM | POA: Diagnosis not present

## 2024-01-15 DIAGNOSIS — L239 Allergic contact dermatitis, unspecified cause: Secondary | ICD-10-CM | POA: Diagnosis not present

## 2024-01-15 DIAGNOSIS — E782 Mixed hyperlipidemia: Secondary | ICD-10-CM | POA: Diagnosis not present

## 2024-01-15 DIAGNOSIS — E119 Type 2 diabetes mellitus without complications: Secondary | ICD-10-CM | POA: Diagnosis not present

## 2024-01-27 DIAGNOSIS — F32A Depression, unspecified: Secondary | ICD-10-CM | POA: Diagnosis not present

## 2024-01-27 DIAGNOSIS — F410 Panic disorder [episodic paroxysmal anxiety] without agoraphobia: Secondary | ICD-10-CM | POA: Diagnosis not present

## 2024-01-27 DIAGNOSIS — E538 Deficiency of other specified B group vitamins: Secondary | ICD-10-CM | POA: Diagnosis not present

## 2024-02-10 DIAGNOSIS — F32A Depression, unspecified: Secondary | ICD-10-CM | POA: Diagnosis not present

## 2024-02-10 DIAGNOSIS — F410 Panic disorder [episodic paroxysmal anxiety] without agoraphobia: Secondary | ICD-10-CM | POA: Diagnosis not present

## 2024-03-23 DIAGNOSIS — Z23 Encounter for immunization: Secondary | ICD-10-CM | POA: Diagnosis not present

## 2024-04-06 DIAGNOSIS — E538 Deficiency of other specified B group vitamins: Secondary | ICD-10-CM | POA: Diagnosis not present

## 2024-05-11 DIAGNOSIS — E538 Deficiency of other specified B group vitamins: Secondary | ICD-10-CM | POA: Diagnosis not present

## 2024-06-09 DIAGNOSIS — E538 Deficiency of other specified B group vitamins: Secondary | ICD-10-CM | POA: Diagnosis not present
